# Patient Record
Sex: Male | Born: 1970 | Race: Black or African American | Hispanic: No | Marital: Single | Smoking: Never smoker
Health system: Southern US, Community
[De-identification: ages and names within clinical notes are randomized; demographics above are authoritative.]

## PROBLEM LIST (undated history)

## (undated) DIAGNOSIS — T7840XA Allergy, unspecified, initial encounter: Secondary | ICD-10-CM

## (undated) DIAGNOSIS — E119 Type 2 diabetes mellitus without complications: Secondary | ICD-10-CM

## (undated) DIAGNOSIS — I639 Cerebral infarction, unspecified: Secondary | ICD-10-CM

## (undated) DIAGNOSIS — K649 Unspecified hemorrhoids: Secondary | ICD-10-CM

## (undated) DIAGNOSIS — I10 Essential (primary) hypertension: Secondary | ICD-10-CM

## (undated) HISTORY — DX: Allergy, unspecified, initial encounter: T78.40XA

## (undated) HISTORY — PX: WISDOM TOOTH EXTRACTION: SHX21

## (undated) HISTORY — DX: Type 2 diabetes mellitus without complications: E11.9

## (undated) HISTORY — DX: Essential (primary) hypertension: I10

---

## 2000-03-12 ENCOUNTER — Emergency Department (HOSPITAL_COMMUNITY): Admission: EM | Admit: 2000-03-12 | Discharge: 2000-03-12 | Payer: Self-pay

## 2011-10-07 ENCOUNTER — Emergency Department (INDEPENDENT_AMBULATORY_CARE_PROVIDER_SITE_OTHER)
Admission: EM | Admit: 2011-10-07 | Discharge: 2011-10-07 | Disposition: A | Discharge status: Home / Self Care | Payer: Self-pay | Source: Home / Self Care | Attending: Emergency Medicine | Admitting: Emergency Medicine

## 2011-10-07 ENCOUNTER — Encounter (HOSPITAL_COMMUNITY): Payer: Self-pay

## 2011-10-07 DIAGNOSIS — K1230 Oral mucositis (ulcerative), unspecified: Secondary | ICD-10-CM

## 2011-10-07 DIAGNOSIS — K121 Other forms of stomatitis: Secondary | ICD-10-CM

## 2011-10-07 DIAGNOSIS — K056 Periodontal disease, unspecified: Secondary | ICD-10-CM

## 2011-10-07 MED ORDER — ACETAMINOPHEN-CODEINE #3 300-30 MG PO TABS: 1.0000 | ORAL_TABLET | Freq: Four times a day (QID) | ORAL | Status: DC | PRN

## 2011-10-07 MED ORDER — PENICILLIN V POTASSIUM 500 MG PO TABS: 500.0000 mg | ORAL_TABLET | Freq: Three times a day (TID) | ORAL | Status: AC

## 2011-10-07 NOTE — ED Provider Notes (Signed)
History     CSN: [id number]  Arrival date & time 10/07/11  1233   First MD Initiated Contact with Patient 10/07/11 1251      Chief Complaint  Patient presents with  . Oral Swelling    (Consider location/radiation/quality/duration/timing/severity/associated sxs/prior treatment) HPI Comments: Patient presents urgent care complaining for approximately 2 weeks he's been having some upper right sided mouth pain that he feels the pain originates from his gum line he has not noticed, any facial swelling, denies any fevers or headaches. Does describe that he have had mental problems before and has been about a year that he has not seen a dentist.  The history is provided by the patient.    History reviewed. No pertinent past medical history.  Past Surgical History  Procedure Date  . Wisdom tooth extraction     No family history on file.  History  Substance Use Topics  . Smoking status: Not on file  . Smokeless tobacco: Not on file  . Alcohol Use:       Review of Systems  Constitutional: Negative for chills, activity change and appetite change.  HENT: Positive for dental problem.   Neurological: Negative for dizziness, facial asymmetry and headaches.    Allergies  Review of patient's allergies indicates no known allergies.  Home Medications   Current Outpatient Rx  Name Route Sig Dispense Refill  . ACETAMINOPHEN-CODEINE #3 300-30 MG PO TABS Oral Take 1-2 tablets by mouth every 6 (six) hours as needed for pain. 15 tablet 0  . PENICILLIN V POTASSIUM 500 MG PO TABS Oral Take 1 tablet (500 mg total) by mouth 3 (three) times daily. 15 tablet 0    BP 121/79  Pulse 72  Temp 97 F (36.1 C) (Oral)  Resp 16  SpO2 100%  Physical Exam  Nursing note and vitals reviewed. Constitutional: He appears well-developed and well-nourished.  HENT:  Right Ear: Tympanic membrane normal.  Left Ear: Tympanic membrane normal.  Mouth/Throat: Uvula is midline. Abnormal dentition. No  uvula swelling. No oropharyngeal exudate or tonsillar abscesses.    Eyes: Conjunctivae normal are normal. Pupils are equal, round, and reactive to light.  Neck: Neck supple.  Lymphadenopathy:    He has no cervical adenopathy.  Neurological: He is alert.  Skin: Skin is warm. No erythema.    ED Course  Procedures (including critical care time)  Labs Reviewed - No data to display No results found.   1. Periodontal disease   2. Stomatitis       MDM  Periodontal disease with gingivitis exam. Patient is being provided with a referral to see our on-call dentist. He started him penicillin for 5 days along with 15 tablets of Tylenol No. 3 for pain and discomfort control. Patient agrees with treatment plan and will followup with dentist as instructed and recommended. On exam no obvious signs of a apical abscess was noted        Jimmie Molly, MD 10/07/11 1544

## 2011-10-07 NOTE — ED Notes (Signed)
Complains of mouth/gum pain, sx started 2 weeks ago, subsided and then returned

## 2013-06-10 ENCOUNTER — Encounter (HOSPITAL_COMMUNITY): Payer: Self-pay | Admitting: Emergency Medicine

## 2013-06-10 ENCOUNTER — Emergency Department (INDEPENDENT_AMBULATORY_CARE_PROVIDER_SITE_OTHER)
Admission: EM | Admit: 2013-06-10 | Discharge: 2013-06-10 | Disposition: A | Discharge status: Home / Self Care | Payer: Self-pay | Source: Home / Self Care | Attending: Family Medicine | Admitting: Family Medicine

## 2013-06-10 DIAGNOSIS — K0889 Other specified disorders of teeth and supporting structures: Secondary | ICD-10-CM

## 2013-06-10 DIAGNOSIS — K029 Dental caries, unspecified: Secondary | ICD-10-CM

## 2013-06-10 DIAGNOSIS — K089 Disorder of teeth and supporting structures, unspecified: Secondary | ICD-10-CM

## 2013-06-10 DIAGNOSIS — K0510 Chronic gingivitis, plaque induced: Secondary | ICD-10-CM

## 2013-06-10 MED ORDER — PENICILLIN V POTASSIUM 500 MG PO TABS: 500.0000 mg | ORAL_TABLET | Freq: Three times a day (TID) | ORAL | Status: AC

## 2013-06-10 MED ORDER — OXYCODONE-ACETAMINOPHEN 5-325 MG PO TABS: 1.0000 | ORAL_TABLET | ORAL | Status: DC | PRN

## 2013-06-10 NOTE — ED Provider Notes (Signed)
Medical screening examination/treatment/procedure(s) were performed by a resident physician or non-physician practitioner and as the supervising physician I was immediately available for consultation/collaboration.  Panagiota Perfetti, MD    Eshan Trupiano S Latima Hamza, MD 06/10/13 1815 

## 2013-06-10 NOTE — ED Notes (Signed)
Reports right , upper tooth issues, several teeth.  This episode started 2 days ago.  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

## 2013-06-10 NOTE — Discharge Instructions (Signed)
Take medications as directed and arrange follow up with the dentist as soon as possible.  Dental Caries Dental caries is tooth decay. This decay can cause a hole in teeth (cavity) that can get bigger and deeper over time. HOME CARE  Brush and floss your teeth. Do this at least two times a day.  Use a fluoride toothpaste.  Use a mouth rinse if told by your dentist or doctor.  Eat less sugary and starchy foods. Drink less sugary drinks.  Avoid snacking often on sugary and starchy foods. Avoid sipping often on sugary drinks.  Keep regular checkups and cleanings with your dentist.  Use fluoride supplements if told by your dentist or doctor.  Allow fluoride to be applied to teeth if told by your dentist or doctor. MAKE SURE YOU:  Understand these instructions.  Will watch your condition.  Will get help right away if you are not doing well or get worse. Document Released: 10/01/2007 Document Revised: 08/24/2012 Document Reviewed: 12/25/2011 [facility] Patient Information 2014 [place], [place].  Dental Pain Toothache is pain in or around a tooth. It may get worse with chewing or with cold or heat.  HOME CARE  Your dentist may use a numbing medicine during treatment. If so, you may need to avoid eating until the medicine wears off. Ask your dentist about this.  Only take medicine as told by your dentist or doctor.  Avoid chewing food near the painful tooth until after all treatment is done. Ask your dentist about this. GET HELP RIGHT AWAY IF:   The problem gets worse or new problems appear.  You have a fever.  There is redness and puffiness (swelling) of the face, jaw, or neck.  You cannot open your mouth.  There is pain in the jaw.  There is very bad pain that is not helped by medicine. MAKE SURE YOU:   Understand these instructions.  Will watch your condition.  Will get help right away if you are not doing well or get worse. Document Released: 06/10/2007 Document  Revised: 03/16/2011 Document Reviewed: 06/10/2007 [facility] Patient Information 2014 [place], [place].  Gingivitis  Gingivitis is an infection of the teeth and bones that support the teeth. Your gums become red, sore, and puffy (swollen). It is caused by germs that build up on your teeth and gums (plaque). HOME CARE  Floss and then brush your teeth.  Brush at least twice a day.  Floss at least once a day.  Avoid sugar between meals.  Do not drink juice before bed. Only drink water.  Make and keep your regular checkups and cleanings with your dentist.  Use any mouth care product or toothpaste as told by your dentist. GET HELP RIGHT AWAY IF:  You have painful, red tissue around your teeth.  You have trouble chewing.  You have loose or infected teeth. MAKE SURE YOU:  Understand these instructions.  Will watch your condition.  Will get help right away if you are not doing well or get worse. Document Released: 01/24/2010 Document Revised: 03/16/2011 Document Reviewed: 01/24/2010 [facility] Patient Information 2014 [place], [place].  Periodontal Disease Periodontal disease, or gum disease, is a type of oral disease that affects the surrounding and supporting tissues of the teeth. These include the gums (gingivae), ligaments, and tooth socket (alveolar bone). Periodontal disease can affect one tooth or many teeth. If left untreated, it may lead to tooth loss.  CAUSES The main cause of periodontal disease is dental plaque, which contains harmful bacteria. These bacteria  can cause the gums to become inflamed and infected. Further progression of the disease can damage the other supporting tissues.  RISK FACTORS  Diabetes.   Smoking and tobacco use.   Genetics.   Hormonal changes of puberty, menopause, and pregnancy.   Stress.   Clenching or grinding your teeth.   Substance abuse.  Poor nutrition.   Diseases that interfere with the body's immune system.   Certain  medicines. SIGNS AND SYMPTOMS  Red or swollen gums.  Bad breath that does not go away.  Gums that have pulled away from the teeth.  Gums that bleed easily.  Permanent teeth that are loose or separating.  Pain when chewing.  Changes in the way your teeth fit together.  Sensitive teeth. DIAGNOSIS  A thorough examination of the periodontal tissues will be done by your dentist. X-rays may be needed. Evaluation of your medical history will be needed to see if there are other factors or underlying conditions that may contribute to the disease. TREATMENT The number and types of treatment will vary depending on the extent of the disease. Treatment may include brushing and flossing only. Further disease progression may necessitate scaling and root planing or even surgery. The main goal is to control the infection. Good oral hygiene at home is necessary for the success of all types of treatment. HOME CARE INSTRUCTIONS   Practice good oral hygiene. This includes flossing and brushing your teeth every day.   See your dentist regularly, at least 2 times per year.   Stop smoking if you smoke.  Eat a well-balanced diet. SEEK IMMEDIATE DENTAL CARE IF:   You have any signs or symptoms of periodontal disease along with:  Swelling of your face, neck, or jaw.  Inability to open your mouth.  Severe pain uncontrolled by pain medicine.  You have a fever or persistent symptoms for more than 2 3 days.  You have a fever and your symptoms suddenly get worse. Document Released: 12/25/2002 Document Revised: 08/24/2012 Document Reviewed: 05/31/2012 [facility] Patient Information 2014 [place]

## 2013-06-10 NOTE — ED Provider Notes (Signed)
CSN: [id number]     Arrival date & time 06/10/13  1520 History   First MD Initiated Contact with Patient 06/10/13 1541     Chief Complaint  Patient presents with  . Dental Pain   (Consider location/radiation/quality/duration/timing/severity/associated sxs/prior Treatment) Patient is a 43 y.o. male presenting with tooth pain. The history is provided by the patient.  Dental Pain Location:  Upper Upper teeth location:  2/RU 2nd molar and 1/RU 3rd molar Quality:  Aching and throbbing Severity:  Moderate Onset quality:  Gradual Duration:  2 days Timing:  Constant Progression:  Worsening Chronicity:  New Context: dental caries and filling fell out   Relieved by:  Nothing Worsened by:  Cold food/drink and pressure Associated symptoms: gum swelling   Associated symptoms: no difficulty swallowing, no facial pain, no facial swelling, no fever, no neck pain, no neck swelling, no oral bleeding and no oral lesions   Risk factors: lack of dental care and periodontal disease   Risk factors: no alcohol problem, no cancer, no diabetes, no immunosuppression and no smoking   Pt reports known loss of a filling to RU 3rd molar approx 1 year ago. Has not given him problems until recently. 2 day h/o worsening pain. Also has known early peridontal disease and states his gums around the RU 2nd and 3rd molars is very TTP.   History reviewed. No pertinent past medical history. Past Surgical History  Procedure Laterality Date  . Wisdom tooth extraction     No family history on file. History  Substance Use Topics  . Smoking status: Never Smoker   . Smokeless tobacco: Not on file  . Alcohol Use: No    Review of Systems  Constitutional: Negative for fever.  HENT: Negative for facial swelling and mouth sores.   Musculoskeletal: Negative for neck pain.  All other systems reviewed and are negative.   Allergies  Review of patient's allergies indicates no known allergies.  Home Medications   Prior to  Admission medications   Medication Sig Start Date End Date Taking? Authorizing Provider  ibuprofen (ADVIL,MOTRIN) 200 MG tablet Take 200 mg by mouth every 6 (six) hours as needed.   Yes Historical Provider, MD  acetaminophen-codeine (TYLENOL #3) 300-30 MG per tablet Take 1-2 tablets by mouth every 6 (six) hours as needed for pain. 10/07/11   Jimmie MollyPaolo Coll, MD  oxyCODONE-acetaminophen (PERCOCET/ROXICET) 5-325 MG per tablet Take 1 tablet by mouth every 4 (four) hours as needed for severe pain. 06/10/13   Roma KayserKatherine P Emmarae Cowdery, NP  penicillin v potassium (VEETID) 500 MG tablet Take 1 tablet (500 mg total) by mouth 3 (three) times daily. 06/10/13 06/17/13  Roma KayserKatherine P Derryl Uher, NP   BP 124/72  Pulse 79  Temp(Src) 98 F (36.7 C) (Oral)  Resp 17  SpO2 97% Physical Exam  Nursing note and vitals reviewed. Constitutional: He is oriented to person, place, and time. He appears well-developed and well-nourished.  HENT:  Head: Normocephalic and atraumatic.  Mouth/Throat:    Eyes: Conjunctivae are normal.  Neck: Neck supple.  Cardiovascular: Normal rate.   Pulmonary/Chest: Effort normal.  Neurological: He is alert and oriented to person, place, and time.  Skin: Skin is warm and dry.  Psychiatric: He has a normal mood and affect.    ED Course  Procedures (including critical care time) Labs Review Labs Reviewed - No data to display  Imaging Review No results found.   MDM   1. Tooth decay   2. Gingivitis   3. Toothache  Pen-V-K 500 mg TID x 10 days w/ a short course of medication for pain and f/u w/ dentist.    Roma Kayser Aariya Ferrick, NP 06/10/13 [phone]

## 2014-05-25 ENCOUNTER — Ambulatory Visit (INDEPENDENT_AMBULATORY_CARE_PROVIDER_SITE_OTHER): Payer: BC Managed Care – PPO

## 2014-05-25 ENCOUNTER — Ambulatory Visit (INDEPENDENT_AMBULATORY_CARE_PROVIDER_SITE_OTHER): Payer: BC Managed Care – PPO | Admitting: Emergency Medicine

## 2014-05-25 VITALS — BP 110/80 | HR 88 | Temp 98.9°F | Resp 16 | Ht 70.0 in | Wt 218.0 lb

## 2014-05-25 DIAGNOSIS — R5382 Chronic fatigue, unspecified: Secondary | ICD-10-CM

## 2014-05-25 DIAGNOSIS — R0602 Shortness of breath: Secondary | ICD-10-CM

## 2014-05-25 DIAGNOSIS — Z0000 Encounter for general adult medical examination without abnormal findings: Secondary | ICD-10-CM | POA: Diagnosis not present

## 2014-05-25 DIAGNOSIS — K625 Hemorrhage of anus and rectum: Secondary | ICD-10-CM

## 2014-05-25 DIAGNOSIS — Z23 Encounter for immunization: Secondary | ICD-10-CM

## 2014-05-25 DIAGNOSIS — Z13220 Encounter for screening for lipoid disorders: Secondary | ICD-10-CM

## 2014-05-25 LAB — POCT CBC
GRANULOCYTE PERCENT: 47.3 % (ref 37–80)
HCT, POC: 47.8 % (ref 43.5–53.7)
Hemoglobin: 15 g/dL (ref 14.1–18.1)
Lymph, poc: 2.6 (ref 0.6–3.4)
MCH, POC: 29.5 pg (ref 27–31.2)
MCHC: 31.5 g/dL — AB (ref 31.8–35.4)
MCV: 93.6 fL (ref 80–97)
MID (CBC): 0.4 (ref 0–0.9)
MPV: 8.4 fL (ref 0–99.8)
PLATELET COUNT, POC: 188 10*3/uL (ref 142–424)
POC GRANULOCYTE: 2.7 (ref 2–6.9)
POC LYMPH PERCENT: 45.9 %L (ref 10–50)
POC MID %: 6.8 % (ref 0–12)
RBC: 5.1 M/uL (ref 4.69–6.13)
RDW, POC: 14.6 %
WBC: 5.7 10*3/uL (ref 4.6–10.2)

## 2014-05-25 LAB — LIPID PANEL
CHOL/HDL RATIO: 6.8 ratio
CHOLESTEROL: 239 mg/dL — AB (ref 0–200)
HDL: 35 mg/dL — ABNORMAL LOW (ref >=40)
LDL Cholesterol: 180 mg/dL — ABNORMAL HIGH (ref 0–99)
TRIGLYCERIDES: 118 mg/dL (ref <150)
VLDL: 24 mg/dL (ref 0–40)

## 2014-05-25 LAB — POCT UA - MICROSCOPIC ONLY
BACTERIA, U MICROSCOPIC: NEGATIVE
CRYSTALS, UR, HPF, POC: NEGATIVE
Casts, Ur, LPF, POC: NEGATIVE
Mucus, UA: POSITIVE
Yeast, UA: NEGATIVE

## 2014-05-25 LAB — COMPLETE METABOLIC PANEL WITH GFR
ALBUMIN: 4.1 g/dL (ref 3.5–5.2)
ALT: 33 U/L (ref 0–53)
AST: 21 U/L (ref 0–37)
Alkaline Phosphatase: 71 U/L (ref 39–117)
BUN: 8 mg/dL (ref 6–23)
CALCIUM: 9.5 mg/dL (ref 8.4–10.5)
CHLORIDE: 104 meq/L (ref 96–112)
CO2: 27 meq/L (ref 19–32)
Creat: 1.09 mg/dL (ref 0.50–1.35)
GFR, EST NON AFRICAN AMERICAN: 82 mL/min
GFR, Est African American: >89 mL/min
GLUCOSE: 108 mg/dL — AB (ref 70–99)
Potassium: 4.1 mEq/L (ref 3.5–5.3)
Sodium: 139 mEq/L (ref 135–145)
Total Bilirubin: 0.5 mg/dL (ref 0.2–1.2)
Total Protein: 7.5 g/dL (ref 6.0–8.3)

## 2014-05-25 LAB — POCT URINALYSIS DIPSTICK
Bilirubin, UA: NEGATIVE
Glucose, UA: NEGATIVE
Ketones, UA: NEGATIVE
Leukocytes, UA: NEGATIVE
Nitrite, UA: NEGATIVE
PROTEIN UA: NEGATIVE
UROBILINOGEN UA: 0.2
pH, UA: 5.5

## 2014-05-25 LAB — IFOBT (OCCULT BLOOD): IMMUNOLOGICAL FECAL OCCULT BLOOD TEST: NEGATIVE

## 2014-05-25 LAB — TSH: TSH: 0.607 u[IU]/mL (ref 0.350–4.500)

## 2014-05-25 MED ORDER — TETANUS-DIPHTH-ACELL PERTUSSIS 5-2.5-18.5 LF-MCG/0.5 IM SUSP
0.5000 mL | Freq: Once | INTRAMUSCULAR | Status: AC
  Administered 2014-05-25: 0.5 mL via INTRAMUSCULAR

## 2014-05-25 NOTE — Patient Instructions (Signed)
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
## 2014-05-26 ENCOUNTER — Encounter: Payer: Self-pay | Admitting: Emergency Medicine

## 2014-05-26 LAB — HEPATITIS C ANTIBODY: HCV Ab: NEGATIVE

## 2014-05-26 LAB — PSA: PSA: 0.3 ng/mL (ref <=4.00)

## 2014-05-26 LAB — HIV ANTIBODY (ROUTINE TESTING W REFLEX): HIV 1&2 Ab, 4th Generation: NONREACTIVE

## 2015-03-30 ENCOUNTER — Ambulatory Visit (INDEPENDENT_AMBULATORY_CARE_PROVIDER_SITE_OTHER): Payer: BC Managed Care – PPO | Admitting: Family Medicine

## 2015-03-30 VITALS — BP 118/82 | HR 85 | Temp 98.5°F | Resp 16 | Ht 69.5 in | Wt 215.0 lb

## 2015-03-30 DIAGNOSIS — R079 Chest pain, unspecified: Secondary | ICD-10-CM

## 2015-03-30 DIAGNOSIS — R9431 Abnormal electrocardiogram [ECG] [EKG]: Secondary | ICD-10-CM | POA: Diagnosis not present

## 2015-03-30 DIAGNOSIS — R739 Hyperglycemia, unspecified: Secondary | ICD-10-CM | POA: Diagnosis not present

## 2015-03-30 DIAGNOSIS — H6121 Impacted cerumen, right ear: Secondary | ICD-10-CM | POA: Diagnosis not present

## 2015-03-30 DIAGNOSIS — Z658 Other specified problems related to psychosocial circumstances: Secondary | ICD-10-CM

## 2015-03-30 DIAGNOSIS — E785 Hyperlipidemia, unspecified: Secondary | ICD-10-CM

## 2015-03-30 DIAGNOSIS — R109 Unspecified abdominal pain: Secondary | ICD-10-CM | POA: Diagnosis not present

## 2015-03-30 DIAGNOSIS — K921 Melena: Secondary | ICD-10-CM | POA: Diagnosis not present

## 2015-03-30 DIAGNOSIS — E119 Type 2 diabetes mellitus without complications: Secondary | ICD-10-CM

## 2015-03-30 DIAGNOSIS — R12 Heartburn: Secondary | ICD-10-CM

## 2015-03-30 DIAGNOSIS — F439 Reaction to severe stress, unspecified: Secondary | ICD-10-CM

## 2015-03-30 LAB — POCT CBC
Granulocyte percent: 37.8 %G (ref 37–80)
HCT, POC: 43.9 % (ref 43.5–53.7)
HEMOGLOBIN: 15.4 g/dL (ref 14.1–18.1)
LYMPH, POC: 3.6 — AB (ref 0.6–3.4)
MCH: 31.5 pg — AB (ref 27–31.2)
MCHC: 35 g/dL (ref 31.8–35.4)
MCV: 89.8 fL (ref 80–97)
MID (cbc): 0.6 (ref 0–0.9)
MPV: 8.4 fL (ref 0–99.8)
PLATELET COUNT, POC: 165 10*3/uL (ref 142–424)
POC Granulocyte: 2.6 (ref 2–6.9)
POC LYMPH PERCENT: 53.3 %L — AB (ref 10–50)
POC MID %: 8.9 %M (ref 0–12)
RBC: 4.89 M/uL (ref 4.69–6.13)
RDW, POC: 14 %
WBC: 6.8 10*3/uL (ref 4.6–10.2)

## 2015-03-30 LAB — GLUCOSE, POCT (MANUAL RESULT ENTRY): POC GLUCOSE: 98 mg/dL (ref 70–99)

## 2015-03-30 LAB — POCT GLYCOSYLATED HEMOGLOBIN (HGB A1C): Hemoglobin A1C: 6.6

## 2015-03-30 MED ORDER — OMEPRAZOLE 20 MG PO CPDR: 20.0000 mg | DELAYED_RELEASE_CAPSULE | Freq: Every day | ORAL | Status: DC

## 2015-03-30 NOTE — Patient Instructions (Addendum)
IF you received an x-ray today, you will receive an invoice from [facility]. Please contact [facility] at [phone] with questions or concerns regarding your invoice.   IF you received labwork today, you will receive an invoice from [employer]. Please contact [facility] at [phone] with questions or concerns regarding your invoice.   Our billing staff will not be able to assist you with questions regarding bills from these companies.  You will be contacted with the lab results as soon as they are available. The fastest way to get your results is to activate your My Chart account. Instructions are located on the last page of this paperwork. If you have not heard from [place] regarding the results in 2 weeks, please contact this office.     If ears are blocked again in the future, you can try [facility] over-the-counter then return if not improved in 1-2 days.  Your 3 month average does indicate diabetes, but borderline. Can work on diet, exercise for now, recheck this level in the next 3 months. I will check a test for protein in the urine as this is recommended once a year for diabetes. See other information below for diabetes.  Start over-the-counter Prilosec once per day, and avoid foods below that may worsen reflux or heartburn, as these may also affect your stomach. I will refer you to a gastroenterologist to discuss the blood in the stool, and abdominal pain further. I am checking some kidney and liver tests, but your blood count here looks okay.  See information below for stress and stress management. If you feel this is worsening, return to discuss this further.  Your EKG is similar to the one you had last year, but questionable abnormality on a few areas. I will refer you to cardiology to discuss this further to determine if other testing needed, but if any increased chest pain or worsening symptoms prior to seeing cardiology -  return here  or emergency room. Start aspirin 81 mg per day. See more information below on chest pain.  Return to the clinic or go to the nearest emergency room if any of your symptoms worsen or new symptoms occur.   Nonspecific Chest Pain  Chest pain can be caused by many different conditions. There is always a chance that your pain could be related to something serious, such as a heart attack or a blood clot in your lungs. Chest pain can also be caused by conditions that are not life-threatening. If you have chest pain, it is very important to follow up with your health care provider. CAUSES  Chest pain can be caused by:  Heartburn.  Pneumonia or bronchitis.  Anxiety or stress.  Inflammation around your heart (pericarditis) or lung (pleuritis or pleurisy).  A blood clot in your lung.  A collapsed lung (pneumothorax). It can develop suddenly on its own (spontaneous pneumothorax) or from trauma to the chest.  Shingles infection (varicella-zoster virus).  Heart attack.  Damage to the bones, muscles, and cartilage that make up your chest wall. This can include:  Bruised bones due to injury.  Strained muscles or cartilage due to frequent or repeated coughing or overwork.  Fracture to one or more ribs.  Sore cartilage due to inflammation (costochondritis). RISK FACTORS  Risk factors for chest pain may include:  Activities that increase your risk for trauma or injury to your chest.  Respiratory infections or conditions that cause frequent coughing.  Medical conditions or overeating that can  cause heartburn.  Heart disease or family history of heart disease.  Conditions or health behaviors that increase your risk of developing a blood clot.  Having had chicken pox (varicella zoster). SIGNS AND SYMPTOMS Chest pain can feel like:  Burning or tingling on the surface of your chest or deep in your chest.  Crushing, pressure, aching, or squeezing pain.  Dull or sharp pain that is worse  when you move, cough, or take a deep breath.  Pain that is also felt in your back, neck, shoulder, or arm, or pain that spreads to any of these areas. Your chest pain may come and go, or it may stay constant. DIAGNOSIS Lab tests or other studies may be needed to find the cause of your pain. Your health care provider may have you take a test called an ambulatory ECG (electrocardiogram). An ECG records your heartbeat patterns at the time the test is performed. You may also have other tests, such as:  Transthoracic echocardiogram (TTE). During echocardiography, sound waves are used to create a picture of all of the heart structures and to look at how blood flows through your heart.  Transesophageal echocardiogram (TEE).This is a more advanced imaging test that obtains images from inside your body. It allows your health care provider to see your heart in finer detail.  Cardiac monitoring. This allows your health care provider to monitor your heart rate and rhythm in real time.  Holter monitor. This is a portable device that records your heartbeat and can help to diagnose abnormal heartbeats. It allows your health care provider to track your heart activity for several days, if needed.  Stress tests. These can be done through exercise or by taking medicine that makes your heart beat more quickly.  Blood tests.  Imaging tests. TREATMENT  Your treatment depends on what is causing your chest pain. Treatment may include:  Medicines. These may include:  Acid blockers for heartburn.  Anti-inflammatory medicine.  Pain medicine for inflammatory conditions.  Antibiotic medicine, if an infection is present.  Medicines to dissolve blood clots.  Medicines to treat coronary artery disease.  Supportive care for conditions that do not require medicines. This may include:  Resting.  Applying heat or cold packs to injured areas.  Limiting activities until pain decreases. HOME CARE  INSTRUCTIONS  If you were prescribed an antibiotic medicine, finish it all even if you start to feel better.  Avoid any activities that bring on chest pain.  Do not use any tobacco products, including cigarettes, chewing tobacco, or electronic cigarettes. If you need help quitting, ask your health care provider.  Do not drink alcohol.  Take medicines only as directed by your health care provider.  Keep all follow-up visits as directed by your health care provider. This is important. This includes any further testing if your chest pain does not go away.  If heartburn is the cause for your chest pain, you may be told to keep your head raised (elevated) while sleeping. This reduces the chance that acid will go from your stomach into your esophagus.  Make lifestyle changes as directed by your health care provider. These may include:  Getting regular exercise. Ask your health care provider to suggest some activities that are safe for you.  Eating a heart-healthy diet. A registered dietitian can help you to learn healthy eating options.  Maintaining a healthy weight.  Managing diabetes, if necessary.  Reducing stress. SEEK MEDICAL CARE IF:  Your chest pain does not go away after  treatment.  You have a rash with blisters on your chest.  You have a fever. SEEK IMMEDIATE MEDICAL CARE IF:   Your chest pain is worse.  You have an increasing cough, or you cough up blood.  You have severe abdominal pain.  You have severe weakness.  You faint.  You have chills.  You have sudden, unexplained chest discomfort.  You have sudden, unexplained discomfort in your arms, back, neck, or jaw.  You have shortness of breath at any time.  You suddenly start to sweat, or your skin gets clammy.  You feel nauseous or you vomit.  You suddenly feel light-headed or dizzy.  Your heart begins to beat quickly, or it feels like it is skipping beats. These symptoms may represent a serious  problem that is an emergency. Do not wait to see if the symptoms will go away. Get medical help right away. Call your local emergency services (911 in the [place] Do not drive yourself to the hospital.   This information is not intended to replace advice given to you by your health care provider. Make sure you discuss any questions you have with your health care provider.   Document Released: 10/01/2004 Document Revised: 01/12/2014 Document Reviewed: 07/28/2013 Elsevier Interactive Patient Education 2016 [place] for Gastroesophageal Reflux Disease, Adult When you have gastroesophageal reflux disease (GERD), the foods you eat and your eating habits are very important. Choosing the right foods can help ease the discomfort of GERD. WHAT GENERAL GUIDELINES DO I NEED TO FOLLOW?  Choose fruits, vegetables, whole grains, low-fat dairy products, and low-fat meat, fish, and poultry.  Limit fats such as oils, salad dressings, butter, nuts, and avocado.  Keep a food diary to identify foods that cause symptoms.  Avoid foods that cause reflux. These may be different for different people.  Eat frequent small meals instead of three large meals each day.  Eat your meals slowly, in a relaxed setting.  Limit fried foods.  Cook foods using methods other than frying.  Avoid drinking alcohol.  Avoid drinking large amounts of liquids with your meals.  Avoid bending over or lying down until 2-3 hours after eating. WHAT FOODS ARE NOT RECOMMENDED? The following are some foods and drinks that may worsen your symptoms: Vegetables Tomatoes. Tomato juice. Tomato and spaghetti sauce. Chili peppers. Onion and garlic. Horseradish. Fruits Oranges, grapefruit, and lemon (fruit and juice). Meats High-fat meats, fish, and poultry. This includes hot dogs, ribs, ham, sausage, salami, and bacon. Dairy Whole milk and chocolate milk. Sour cream. Cream. Butter. Ice cream. Cream cheese.   Beverages Coffee and tea, with or without caffeine. Carbonated beverages or energy drinks. Condiments Hot sauce. Barbecue sauce.  Sweets/Desserts Chocolate and cocoa. Donuts. Peppermint and spearmint. Fats and Oils High-fat foods, including [place] fries and potato chips. Other Vinegar. Strong spices, such as black pepper, white pepper, red pepper, cayenne, curry powder, cloves, ginger, and chili powder. The items listed above may not be a complete list of foods and beverages to avoid. Contact your dietitian for more information.   This information is not intended to replace advice given to you by your health care provider. Make sure you discuss any questions you have with your health care provider.   Document Released: 12/22/2004 Document Revised: 01/12/2014 Document Reviewed: 10/26/2012 Elsevier Interactive Patient Education 2016 [place].   Diabetes and Standards of Medical Care Diabetes is complicated. You may find that your diabetes team includes a  dietitian, nurse, diabetes educator, eye doctor, and more. To help everyone know what is going on and to help you get the care you deserve, the following schedule of care was developed to help keep you on track. Below are the tests, exams, vaccines, medicines, education, and plans you will need. HbA1c test This test shows how well you have controlled your glucose over the past 2-3 months. It is used to see if your diabetes management plan needs to be adjusted.   It is performed at least 2 times a year if you are meeting treatment goals.  It is performed 4 times a year if therapy has changed or if you are not meeting treatment goals. Blood pressure test  This test is performed at every routine medical visit. The goal is less than 140/90 mm Hg for most people, but 130/80 mm Hg in some cases. Ask your health care provider about your goal. Dental exam  Follow up with the dentist regularly. Eye exam  If you are diagnosed with type 1  diabetes as a child, get an exam upon reaching the age of 10 years or older and having had diabetes for 3-5 years. Yearly eye exams are recommended after that initial eye exam.  If you are diagnosed with type 1 diabetes as an adult, get an exam within 5 years of diagnosis and then yearly.  If you are diagnosed with type 2 diabetes, get an exam as soon as possible after the diagnosis and then yearly. Foot care exam  Visual foot exams are performed at every routine medical visit. The exams check for cuts, injuries, or other problems with the feet.  You should have a complete foot exam performed every year. This exam includes an inspection of the structure and skin of your feet, a check of the pulses in your feet, and a check of the sensation in your feet.  Type 1 diabetes: The first exam is performed 5 years after diagnosis.  Type 2 diabetes: The first exam is performed at the time of diagnosis.  Check your feet nightly for cuts, injuries, or other problems with your feet. Tell your health care provider if anything is not healing. Kidney function test (urine microalbumin)  This test is performed once a year.  Type 1 diabetes: The first test is performed 5 years after diagnosis.  Type 2 diabetes: The first test is performed at the time of diagnosis.  A serum creatinine and estimated glomerular filtration rate (eGFR) test is done once a year to assess the level of chronic kidney disease (CKD), if present. Lipid profile (cholesterol, HDL, LDL, triglycerides)  Performed every 5 years for most people.  The goal for LDL is less than 100 mg/dL. If you are at high risk, the goal is less than 70 mg/dL.  The goal for HDL is 40 mg/dL-50 mg/dL for men and 50 SK/SH-38 mg/dL for women. An HDL cholesterol of 60 mg/dL or higher gives some protection against heart disease.  The goal for triglycerides is less than 150 mg/dL. Immunizations  The flu (influenza) vaccine is recommended yearly for every  person 20 months of age or older who has diabetes.  The pneumonia (pneumococcal) vaccine is recommended for every person 52 years of age or older who has diabetes. Adults 108 years of age or older may receive the pneumonia vaccine as a series of two separate shots.  The hepatitis B vaccine is recommended for adults shortly after they have been diagnosed with diabetes.  The Tdap (tetanus,  diphtheria, and pertussis) vaccine should be given:  According to normal childhood vaccination schedules, for children.  Every 10 years, for adults who have diabetes. Diabetes self-management education  Education is recommended at diagnosis and ongoing as needed. Treatment plan  Your treatment plan is reviewed at every medical visit.   This information is not intended to replace advice given to you by your health care provider. Make sure you discuss any questions you have with your health care provider.   Document Released: 10/19/2008 Document Revised: 01/12/2014 Document Reviewed: 05/24/2012 Elsevier Interactive Patient Education 2016 [place] and Stress Management Stress is a normal reaction to life events. It is what you feel when life demands more than you are used to or more than you can handle. Some stress can be useful. For example, the stress reaction can help you catch the last bus of the day, study for a test, or meet a deadline at work. But stress that occurs too often or for too long can cause problems. It can affect your emotional health and interfere with relationships and normal daily activities. Too much stress can weaken your immune system and increase your risk for physical illness. If you already have a medical problem, stress can make it worse. CAUSES  All sorts of life events may cause stress. An event that causes stress for one person may not be stressful for another person. Major life events commonly cause stress. These may be positive or negative. Examples include losing  your job, moving into a new home, getting married, having a baby, or losing a loved one. Less obvious life events may also cause stress, especially if they occur day after day or in combination. Examples include working long hours, driving in traffic, caring for children, being in debt, or being in a difficult relationship. SIGNS AND SYMPTOMS Stress may cause emotional symptoms including, the following:  Anxiety. This is feeling worried, afraid, on edge, overwhelmed, or out of control.  Anger. This is feeling irritated or impatient.  Depression. This is feeling sad, down, helpless, or guilty.  Difficulty focusing, remembering, or making decisions. Stress may cause physical symptoms, including the following:   Aches and pains. These may affect your head, neck, back, stomach, or other areas of your body.  Tight muscles or clenched jaw.  Low energy or trouble sleeping. Stress may cause unhealthy behaviors, including the following:   Eating to feel better (overeating) or skipping meals.  Sleeping too little, too much, or both.  Working too much or putting off tasks (procrastination).  Smoking, drinking alcohol, or using drugs to feel better. DIAGNOSIS  Stress is diagnosed through an assessment by your health care provider. Your health care provider will ask questions about your symptoms and any stressful life events.Your health care provider will also ask about your medical history and may order blood tests or other tests. Certain medical conditions and medicine can cause physical symptoms similar to stress. Mental illness can cause emotional symptoms and unhealthy behaviors similar to stress. Your health care provider may refer you to a mental health professional for further evaluation.  TREATMENT  Stress management is the recommended treatment for stress.The goals of stress management are reducing stressful life events and coping with stress in healthy ways.  Techniques for reducing  stressful life events include the following:  Stress identification. Self-monitor for stress and identify what causes stress for you. These skills may help you to avoid some stressful events.  Time management. Set your priorities, keep a  calendar of events, and learn to say "no." These tools can help you avoid making too many commitments. Techniques for coping with stress include the following:  Rethinking the problem. Try to think realistically about stressful events rather than ignoring them or overreacting. Try to find the positives in a stressful situation rather than focusing on the negatives.  Exercise. Physical exercise can release both physical and emotional tension. The key is to find a form of exercise you enjoy and do it regularly.  Relaxation techniques. These relax the body and mind. Examples include yoga, meditation, tai chi, biofeedback, deep breathing, progressive muscle relaxation, listening to music, being out in nature, journaling, and other hobbies. Again, the key is to find one or more that you enjoy and can do regularly.  Healthy lifestyle. Eat a balanced diet, get plenty of sleep, and do not smoke. Avoid using alcohol or drugs to relax.  Strong support network. Spend time with family, friends, or other people you enjoy being around.Express your feelings and talk things over with someone you trust. Counseling or talktherapy with a mental health professional may be helpful if you are having difficulty managing stress on your own. Medicine is typically not recommended for the treatment of stress.Talk to your health care provider if you think you need medicine for symptoms of stress. HOME CARE INSTRUCTIONS  Keep all follow-up visits as directed by your health care provider.  Take all medicines as directed by your health care provider. SEEK MEDICAL CARE IF:  Your symptoms get worse or you start having new symptoms.  You feel overwhelmed by your problems and can no  longer manage them on your own. SEEK IMMEDIATE MEDICAL CARE IF:  You feel like hurting yourself or someone else.   This information is not intended to replace advice given to you by your health care provider. Make sure you discuss any questions you have with your health care provider.   Document Released: 06/17/2000 Document Revised: 01/12/2014 Document Reviewed: 08/16/2012 Elsevier Interactive Patient Education [employer].

## 2015-03-30 NOTE — Progress Notes (Addendum)
Subjective:    Patient ID: Andre Castro, male    DOB: [birth year], 45 y.o.   MRN: [id number] By signing my name below, I, Judithe Modest, attest that this documentation has been prepared under the direction and in the presence of Merri Ray, MD. Electronically Signed: Judithe Modest, ER Scribe. 03/30/2015. 4:27 PM.  Chief Complaint  Patient presents with  . Annual Exam   HPI HPI Comments: Andre Castro is a 45 y.o. male with a hx of blood in stool, hyperlipidemia, hyperglycemia who presents to [facility] reporting for a complete physical. On further review he is not eligible for a physical until May 20th, 2017 so he is going to have a general checkup. He has had some intermittent abdominal discomfort for the last year as well as intermittent blood in stool, once per month for the last year. He denies night sweats, fatigue or unexplained weight loss. He has no family hx of colon cancer. He has been taking baking soda in water which seems to help his lower abdominal discomfort. He has no family hx of heart disease. He does not drink alcohol. He denies diarrhea or vomiting associated with abdominal discomfort. He declines flu shot. [facility]. His choesterol was high and his A1C was 6.5 at that visit.  He also states he has had some intermittent chest pressure as well as some upper abdominal discomfort for the last few days. It is not associated with eating. He tends to have this sensation when he lays down to go to bed. His job has been more stressful recently.   The pts last physical was in may 2016 with Dr. Everlene Farrier. Noted at that physical, he had an asymptomatic umbilical hernia and hemorrhoids. He was referred to GI. He never reported to GI. At that visit discussed possible sleep apnea and possible sleep test, but sleep testing was deferred. Screening labs were done. Noted to have hyperlipidemia. Lipitor and a baby aspirin were  recommended at that visit, but he never started those medications. He was noted to have hyperglycemia, with a glucose of 108. He had negative HIV and Hep C at that time. He was recommended to follow up in three months. He has not been seen until today.    Cancer screening:  Lab Results  Component Value Date   PSA 0.30 05/25/2014  He has never had a colonoscopy, but would like to have one before age 25.   Immunizations: Immunization History  Administered Date(s) Administered  . Tdap 05/25/2014   Vision: No exam data present   HLD: Lab Results  Component Value Date   CHOL 239* 05/25/2014   HDL 35* 05/25/2014   LDLCALC 180* 05/25/2014   TRIG 118 05/25/2014   CHOLHDL 6.8 05/25/2014   Hyperglycemia: No results found for: HGBA1C  Exercise:   Patient Active Problem List   Diagnosis Date Noted  . Rectal bleeding 05/25/2014   Past Medical History  Diagnosis Date  . Allergy   . Diabetes mellitus without complication [facility])    Past Surgical History  Procedure Laterality Date  . Wisdom tooth extraction     No Known Allergies Prior to Admission medications   Medication Sig Start Date End Date Taking? Authorizing Provider  ibuprofen (ADVIL,MOTRIN) 200 MG tablet Take 200 mg by mouth every 6 (six) hours as needed.    Historical Provider, MD   Social History   Social History  . Marital  Status: Single    Spouse Name: N/A  . Number of Children: N/A  . Years of Education: 20   Occupational History  . Public [employer]    Social History Main Topics  . Smoking status: Never Smoker   . Smokeless tobacco: Never Used  . Alcohol Use: No  . Drug Use: No  . Sexual Activity: No   Other Topics Concern  . Not on file   Social History Narrative    Review of Systems  Constitutional: Negative for fever and chills.  Respiratory: Positive for chest tightness. Negative for shortness of breath.   Cardiovascular: Positive for chest pain. Negative for palpitations.  Gastrointestinal:  Positive for abdominal pain and blood in stool.      Objective:  BP 118/82 mmHg  Pulse 85  Temp(Src) 98.5 F (36.9 C) (Oral)  Resp 16  Ht 5' 9.5" (1.765 m)  Wt 215 lb (97.523 kg)  BMI 31.31 kg/m2  SpO2 96%  Physical Exam  Constitutional: He is oriented to person, place, and time. He appears well-developed and well-nourished. No distress.  HENT:  Head: Normocephalic and atraumatic.  Cerumen in bilateral canals obstructed on the right.   Eyes: EOM are normal. Pupils are equal, round, and reactive to light.  Neck: Neck supple. No JVD present. Carotid bruit is not present.  Cardiovascular: Normal rate, regular rhythm and normal heart sounds.  Exam reveals no gallop and no friction rub.   No murmur heard. Pulmonary/Chest: Effort normal and breath sounds normal. No respiratory distress. He has no rales.  Abdominal:  Negative murphies and mcburny's.   Musculoskeletal: Normal range of motion. He exhibits no edema.  Neurological: He is alert and oriented to person, place, and time. Coordination normal.  Skin: Skin is warm and dry. He is not diaphoretic.  Psychiatric: He has a normal mood and affect. His behavior is normal.  Nursing note and vitals reviewed.  Results for orders placed or performed in visit on 03/30/15  POCT glucose (manual entry)  Result Value Ref Range   POC Glucose 98 70 - 99 mg/dl  POCT CBC  Result Value Ref Range   WBC 6.8 4.6 - 10.2 K/uL   Lymph, poc 3.6 (A) 0.6 - 3.4   POC LYMPH PERCENT 53.3 (A) 10 - 50 %L   MID (cbc) 0.6 0 - 0.9   POC MID % 8.9 0 - 12 %M   POC Granulocyte 2.6 2 - 6.9   Granulocyte percent 37.8 37 - 80 %G   RBC 4.89 4.69 - 6.13 M/uL   Hemoglobin 15.4 14.1 - 18.1 g/dL   HCT, POC 49.3 28.1 - 53.7 %   MCV 89.8 80 - 97 fL   MCH, POC 31.5 (A) 27 - 31.2 pg   MCHC 35.0 31.8 - 35.4 g/dL   RDW, POC 27.2 %   Platelet Count, POC 165 142 - 424 K/uL   MPV 8.4 0 - 99.8 fL  POCT glycosylated hemoglobin (Hb A1C)  Result Value Ref Range   Hemoglobin  A1C 6.6        Assessment & Plan:   Andre Castro is a 45 y.o. male  Initially presented for CPE, but too early based on insurance. Advised to follow-up in May to have this performed.  Nonspecific abnormal electrocardiogram (ECG) (EKG) - Plan: Ambulatory referral to Cardiology Chest pain, unspecified chest pain type - Plan: EKG 12-Lead, Ambulatory referral to Cardiology  -Questionable irregularity/nonspecific T waves on EKG. Less likely cardiac causes chest pain, but  will have him evaluated by cardiology. ER/911 chest pain precautions reviewed.  Abdominal pain, unspecified abdominal location - Plan: Ambulatory referral to Gastroenterology, POCT CBC, omeprazole (PRILOSEC) 20 MG capsule  -Reassuring CBC. Possible gastritis, reflux, or comminution above. Trigger foods discussed for avoidance, start Prilosec 20 mg daily, referred to gastroenterology, RTC precautions if worsening.  Cerumen impaction, right  -I removed some of the initial cerumen with white loop. Followed by lavage bilaterally by staff. Resolution of symptoms both sides. RTC precautions  Hyperglycemia - Plan: POCT glucose (manual entry), Microalbumin, urine, POCT glycosylated hemoglobin (Hb A1C)Controlled type 2 diabetes mellitus without complication, without long-term current use of insulin (HCC)  -Check urine microalbumin, diet control for now. Recheck A1c in 3 months to determine if metformin indicated. Information given on after visit summary.  Hyperlipidemia - Plan: COMPLETE METABOLIC PANEL WITH GFR, Lipid panel  -Lipid panel, CMP pending  Heartburn - Plan: omeprazole (PRILOSEC) 20 MG capsule  -As above, trigger avoidance. Trial of Prilosec 20 mg daily.  Blood in stool  -Intermittent. Hemoglobin stable in office. Refer to gastroenterology for further evaluation, and consideration of colonoscopy.  Situational stress  -Stress management discussed. If he feels these symptoms are worsening, with more anxiety or  depression symptoms, return to clinic to discuss further.  Meds ordered this encounter  Medications  . omeprazole (PRILOSEC) 20 MG capsule    Sig: Take 1 capsule (20 mg total) by mouth daily.    Dispense:  30 capsule    Refill:  3   Patient Instructions       IF you received an x-ray today, you will receive an invoice from [facility]. Please contact [facility] at [phone] with questions or concerns regarding your invoice.   IF you received labwork today, you will receive an invoice from [employer]. Please contact [facility] at [phone] with questions or concerns regarding your invoice.   Our billing staff will not be able to assist you with questions regarding bills from these companies.  You will be contacted with the lab results as soon as they are available. The fastest way to get your results is to activate your My Chart account. Instructions are located on the last page of this paperwork. If you have not heard from [place] regarding the results in 2 weeks, please contact this office.     If ears are blocked again in the future, you can try [facility] over-the-counter then return if not improved in 1-2 days.  Your 3 month average does indicate diabetes, but borderline. Can work on diet, exercise for now, recheck this level in the next 3 months. I will check a test for protein in the urine as this is recommended once a year for diabetes. See other information below for diabetes.  Start over-the-counter Prilosec once per day, and avoid foods below that may worsen reflux or heartburn, as these may also affect your stomach. I will refer you to a gastroenterologist to discuss the blood in the stool, and abdominal pain further. I am checking some kidney and liver tests, but your blood count here looks okay.  See information below for stress and stress management. If you feel this is worsening, return to discuss this further.  Your EKG is  similar to the one you had last year, but questionable abnormality on a few areas. I will refer you to cardiology to discuss this further to determine if other testing needed, but if any increased chest pain or worsening symptoms prior to seeing  cardiology -  return here or emergency room. Start aspirin 81 mg per day. See more information below on chest pain.  Return to the clinic or go to the nearest emergency room if any of your symptoms worsen or new symptoms occur.   Nonspecific Chest Pain  Chest pain can be caused by many different conditions. There is always a chance that your pain could be related to something serious, such as a heart attack or a blood clot in your lungs. Chest pain can also be caused by conditions that are not life-threatening. If you have chest pain, it is very important to follow up with your health care provider. CAUSES  Chest pain can be caused by:  Heartburn.  Pneumonia or bronchitis.  Anxiety or stress.  Inflammation around your heart (pericarditis) or lung (pleuritis or pleurisy).  A blood clot in your lung.  A collapsed lung (pneumothorax). It can develop suddenly on its own (spontaneous pneumothorax) or from trauma to the chest.  Shingles infection (varicella-zoster virus).  Heart attack.  Damage to the bones, muscles, and cartilage that make up your chest wall. This can include:  Bruised bones due to injury.  Strained muscles or cartilage due to frequent or repeated coughing or overwork.  Fracture to one or more ribs.  Sore cartilage due to inflammation (costochondritis). RISK FACTORS  Risk factors for chest pain may include:  Activities that increase your risk for trauma or injury to your chest.  Respiratory infections or conditions that cause frequent coughing.  Medical conditions or overeating that can cause heartburn.  Heart disease or family history of heart disease.  Conditions or health behaviors that increase your risk of  developing a blood clot.  Having had chicken pox (varicella zoster). SIGNS AND SYMPTOMS Chest pain can feel like:  Burning or tingling on the surface of your chest or deep in your chest.  Crushing, pressure, aching, or squeezing pain.  Dull or sharp pain that is worse when you move, cough, or take a deep breath.  Pain that is also felt in your back, neck, shoulder, or arm, or pain that spreads to any of these areas. Your chest pain may come and go, or it may stay constant. DIAGNOSIS Lab tests or other studies may be needed to find the cause of your pain. Your health care provider may have you take a test called an ambulatory ECG (electrocardiogram). An ECG records your heartbeat patterns at the time the test is performed. You may also have other tests, such as:  Transthoracic echocardiogram (TTE). During echocardiography, sound waves are used to create a picture of all of the heart structures and to look at how blood flows through your heart.  Transesophageal echocardiogram (TEE).This is a more advanced imaging test that obtains images from inside your body. It allows your health care provider to see your heart in finer detail.  Cardiac monitoring. This allows your health care provider to monitor your heart rate and rhythm in real time.  Holter monitor. This is a portable device that records your heartbeat and can help to diagnose abnormal heartbeats. It allows your health care provider to track your heart activity for several days, if needed.  Stress tests. These can be done through exercise or by taking medicine that makes your heart beat more quickly.  Blood tests.  Imaging tests. TREATMENT  Your treatment depends on what is causing your chest pain. Treatment may include:  Medicines. These may include:  Acid blockers for heartburn.  Anti-inflammatory medicine.  Pain medicine for inflammatory conditions.  Antibiotic medicine, if an infection is present.  Medicines to  dissolve blood clots.  Medicines to treat coronary artery disease.  Supportive care for conditions that do not require medicines. This may include:  Resting.  Applying heat or cold packs to injured areas.  Limiting activities until pain decreases. HOME CARE INSTRUCTIONS  If you were prescribed an antibiotic medicine, finish it all even if you start to feel better.  Avoid any activities that bring on chest pain.  Do not use any tobacco products, including cigarettes, chewing tobacco, or electronic cigarettes. If you need help quitting, ask your health care provider.  Do not drink alcohol.  Take medicines only as directed by your health care provider.  Keep all follow-up visits as directed by your health care provider. This is important. This includes any further testing if your chest pain does not go away.  If heartburn is the cause for your chest pain, you may be told to keep your head raised (elevated) while sleeping. This reduces the chance that acid will go from your stomach into your esophagus.  Make lifestyle changes as directed by your health care provider. These may include:  Getting regular exercise. Ask your health care provider to suggest some activities that are safe for you.  Eating a heart-healthy diet. A registered dietitian can help you to learn healthy eating options.  Maintaining a healthy weight.  Managing diabetes, if necessary.  Reducing stress. SEEK MEDICAL CARE IF:  Your chest pain does not go away after treatment.  You have a rash with blisters on your chest.  You have a fever. SEEK IMMEDIATE MEDICAL CARE IF:   Your chest pain is worse.  You have an increasing cough, or you cough up blood.  You have severe abdominal pain.  You have severe weakness.  You faint.  You have chills.  You have sudden, unexplained chest discomfort.  You have sudden, unexplained discomfort in your arms, back, neck, or jaw.  You have shortness of breath at  any time.  You suddenly start to sweat, or your skin gets clammy.  You feel nauseous or you vomit.  You suddenly feel light-headed or dizzy.  Your heart begins to beat quickly, or it feels like it is skipping beats. These symptoms may represent a serious problem that is an emergency. Do not wait to see if the symptoms will go away. Get medical help right away. Call your local emergency services (911 in the [place] Do not drive yourself to the hospital.   This information is not intended to replace advice given to you by your health care provider. Make sure you discuss any questions you have with your health care provider.   Document Released: 10/01/2004 Document Revised: 01/12/2014 Document Reviewed: 07/28/2013 Elsevier Interactive Patient Education 2016 [place] for Gastroesophageal Reflux Disease, Adult When you have gastroesophageal reflux disease (GERD), the foods you eat and your eating habits are very important. Choosing the right foods can help ease the discomfort of GERD. WHAT GENERAL GUIDELINES DO I NEED TO FOLLOW?  Choose fruits, vegetables, whole grains, low-fat dairy products, and low-fat meat, fish, and poultry.  Limit fats such as oils, salad dressings, butter, nuts, and avocado.  Keep a food diary to identify foods that cause symptoms.  Avoid foods that cause reflux. These may be different for different people.  Eat frequent small meals instead of three large meals each day.  Eat  your meals slowly, in a relaxed setting.  Limit fried foods.  Cook foods using methods other than frying.  Avoid drinking alcohol.  Avoid drinking large amounts of liquids with your meals.  Avoid bending over or lying down until 2-3 hours after eating. WHAT FOODS ARE NOT RECOMMENDED? The following are some foods and drinks that may worsen your symptoms: Vegetables Tomatoes. Tomato juice. Tomato and spaghetti sauce. Chili peppers. Onion and garlic.  Horseradish. Fruits Oranges, grapefruit, and lemon (fruit and juice). Meats High-fat meats, fish, and poultry. This includes hot dogs, ribs, ham, sausage, salami, and bacon. Dairy Whole milk and chocolate milk. Sour cream. Cream. Butter. Ice cream. Cream cheese.  Beverages Coffee and tea, with or without caffeine. Carbonated beverages or energy drinks. Condiments Hot sauce. Barbecue sauce.  Sweets/Desserts Chocolate and cocoa. Donuts. Peppermint and spearmint. Fats and Oils High-fat foods, including [place] fries and potato chips. Other Vinegar. Strong spices, such as black pepper, white pepper, red pepper, cayenne, curry powder, cloves, ginger, and chili powder. The items listed above may not be a complete list of foods and beverages to avoid. Contact your dietitian for more information.   This information is not intended to replace advice given to you by your health care provider. Make sure you discuss any questions you have with your health care provider.   Document Released: 12/22/2004 Document Revised: 01/12/2014 Document Reviewed: 10/26/2012 Elsevier Interactive Patient Education 2016 [place].   Diabetes and Standards of Medical Care Diabetes is complicated. You may find that your diabetes team includes a dietitian, nurse, diabetes educator, eye doctor, and more. To help everyone know what is going on and to help you get the care you deserve, the following schedule of care was developed to help keep you on track. Below are the tests, exams, vaccines, medicines, education, and plans you will need. HbA1c test This test shows how well you have controlled your glucose over the past 2-3 months. It is used to see if your diabetes management plan needs to be adjusted.   It is performed at least 2 times a year if you are meeting treatment goals.  It is performed 4 times a year if therapy has changed or if you are not meeting treatment goals. Blood pressure test  This test is  performed at every routine medical visit. The goal is less than 140/90 mm Hg for most people, but 130/80 mm Hg in some cases. Ask your health care provider about your goal. Dental exam  Follow up with the dentist regularly. Eye exam  If you are diagnosed with type 1 diabetes as a child, get an exam upon reaching the age of 51 years or older and having had diabetes for 3-5 years. Yearly eye exams are recommended after that initial eye exam.  If you are diagnosed with type 1 diabetes as an adult, get an exam within 5 years of diagnosis and then yearly.  If you are diagnosed with type 2 diabetes, get an exam as soon as possible after the diagnosis and then yearly. Foot care exam  Visual foot exams are performed at every routine medical visit. The exams check for cuts, injuries, or other problems with the feet.  You should have a complete foot exam performed every year. This exam includes an inspection of the structure and skin of your feet, a check of the pulses in your feet, and a check of the sensation in your feet.  Type 1 diabetes: The first exam is performed 5 years after  diagnosis.  Type 2 diabetes: The first exam is performed at the time of diagnosis.  Check your feet nightly for cuts, injuries, or other problems with your feet. Tell your health care provider if anything is not healing. Kidney function test (urine microalbumin)  This test is performed once a year.  Type 1 diabetes: The first test is performed 5 years after diagnosis.  Type 2 diabetes: The first test is performed at the time of diagnosis.  A serum creatinine and estimated glomerular filtration rate (eGFR) test is done once a year to assess the level of chronic kidney disease (CKD), if present. Lipid profile (cholesterol, HDL, LDL, triglycerides)  Performed every 5 years for most people.  The goal for LDL is less than 100 mg/dL. If you are at high risk, the goal is less than 70 mg/dL.  The goal for HDL is 40  mg/dL-50 mg/dL for men and 50 AT/VV-33 mg/dL for women. An HDL cholesterol of 60 mg/dL or higher gives some protection against heart disease.  The goal for triglycerides is less than 150 mg/dL. Immunizations  The flu (influenza) vaccine is recommended yearly for every person 28 months of age or older who has diabetes.  The pneumonia (pneumococcal) vaccine is recommended for every person 70 years of age or older who has diabetes. Adults 41 years of age or older may receive the pneumonia vaccine as a series of two separate shots.  The hepatitis B vaccine is recommended for adults shortly after they have been diagnosed with diabetes.  The Tdap (tetanus, diphtheria, and pertussis) vaccine should be given:  According to normal childhood vaccination schedules, for children.  Every 10 years, for adults who have diabetes. Diabetes self-management education  Education is recommended at diagnosis and ongoing as needed. Treatment plan  Your treatment plan is reviewed at every medical visit.   This information is not intended to replace advice given to you by your health care provider. Make sure you discuss any questions you have with your health care provider.   Document Released: 10/19/2008 Document Revised: 01/12/2014 Document Reviewed: 05/24/2012 Elsevier Interactive Patient Education 2016 Elsevier Inc.  Stress and Stress Management Stress is a normal reaction to life events. It is what you feel when life demands more than you are used to or more than you can handle. Some stress can be useful. For example, the stress reaction can help you catch the last bus of the day, study for a test, or meet a deadline at work. But stress that occurs too often or for too long can cause problems. It can affect your emotional health and interfere with relationships and normal daily activities. Too much stress can weaken your immune system and increase your risk for physical illness. If you already have a medical  problem, stress can make it worse. CAUSES  All sorts of life events may cause stress. An event that causes stress for one person may not be stressful for another person. Major life events commonly cause stress. These may be positive or negative. Examples include losing your job, moving into a new home, getting married, having a baby, or losing a loved one. Less obvious life events may also cause stress, especially if they occur day after day or in combination. Examples include working long hours, driving in traffic, caring for children, being in debt, or being in a difficult relationship. SIGNS AND SYMPTOMS Stress may cause emotional symptoms including, the following:  Anxiety. This is feeling worried, afraid, on edge, overwhelmed, or out  of control.  Anger. This is feeling irritated or impatient.  Depression. This is feeling sad, down, helpless, or guilty.  Difficulty focusing, remembering, or making decisions. Stress may cause physical symptoms, including the following:   Aches and pains. These may affect your head, neck, back, stomach, or other areas of your body.  Tight muscles or clenched jaw.  Low energy or trouble sleeping. Stress may cause unhealthy behaviors, including the following:   Eating to feel better (overeating) or skipping meals.  Sleeping too little, too much, or both.  Working too much or putting off tasks (procrastination).  Smoking, drinking alcohol, or using drugs to feel better. DIAGNOSIS  Stress is diagnosed through an assessment by your health care provider. Your health care provider will ask questions about your symptoms and any stressful life events.Your health care provider will also ask about your medical history and may order blood tests or other tests. Certain medical conditions and medicine can cause physical symptoms similar to stress. Mental illness can cause emotional symptoms and unhealthy behaviors similar to stress. Your health care provider may  refer you to a mental health professional for further evaluation.  TREATMENT  Stress management is the recommended treatment for stress.The goals of stress management are reducing stressful life events and coping with stress in healthy ways.  Techniques for reducing stressful life events include the following:  Stress identification. Self-monitor for stress and identify what causes stress for you. These skills may help you to avoid some stressful events.  Time management. Set your priorities, keep a calendar of events, and learn to say "no." These tools can help you avoid making too many commitments. Techniques for coping with stress include the following:  Rethinking the problem. Try to think realistically about stressful events rather than ignoring them or overreacting. Try to find the positives in a stressful situation rather than focusing on the negatives.  Exercise. Physical exercise can release both physical and emotional tension. The key is to find a form of exercise you enjoy and do it regularly.  Relaxation techniques. These relax the body and mind. Examples include yoga, meditation, tai chi, biofeedback, deep breathing, progressive muscle relaxation, listening to music, being out in nature, journaling, and other hobbies. Again, the key is to find one or more that you enjoy and can do regularly.  Healthy lifestyle. Eat a balanced diet, get plenty of sleep, and do not smoke. Avoid using alcohol or drugs to relax.  Strong support network. Spend time with family, friends, or other people you enjoy being around.Express your feelings and talk things over with someone you trust. Counseling or talktherapy with a mental health professional may be helpful if you are having difficulty managing stress on your own. Medicine is typically not recommended for the treatment of stress.Talk to your health care provider if you think you need medicine for symptoms of stress. HOME CARE  INSTRUCTIONS  Keep all follow-up visits as directed by your health care provider.  Take all medicines as directed by your health care provider. SEEK MEDICAL CARE IF:  Your symptoms get worse or you start having new symptoms.  You feel overwhelmed by your problems and can no longer manage them on your own. SEEK IMMEDIATE MEDICAL CARE IF:  You feel like hurting yourself or someone else.   This information is not intended to replace advice given to you by your health care provider. Make sure you discuss any questions you have with your health care provider.   Document Released: 06/17/2000 Document  Revised: 01/12/2014 Document Reviewed: 08/16/2012 Elsevier Interactive Patient Education [employer].       I personally performed the services described in this documentation, which was scribed in my presence. The recorded information has been reviewed and considered, and addended by me as needed.

## 2015-03-31 LAB — LIPID PANEL
CHOL/HDL RATIO: 6.4 ratio — AB (ref <=5.0)
CHOLESTEROL: 257 mg/dL — AB (ref 125–200)
HDL: 40 mg/dL (ref >=40)
LDL CALC: 192 mg/dL — AB (ref <130)
TRIGLYCERIDES: 124 mg/dL (ref <150)
VLDL: 25 mg/dL (ref <30)

## 2015-03-31 LAB — COMPLETE METABOLIC PANEL WITH GFR
ALT: 37 U/L (ref 9–46)
AST: 25 U/L (ref 10–40)
Albumin: 4.3 g/dL (ref 3.6–5.1)
Alkaline Phosphatase: 59 U/L (ref 40–115)
BUN: 9 mg/dL (ref 7–25)
CALCIUM: 9.5 mg/dL (ref 8.6–10.3)
CHLORIDE: 107 mmol/L (ref 98–110)
CO2: 27 mmol/L (ref 20–31)
Creat: 1.11 mg/dL (ref 0.60–1.35)
GFR, EST NON AFRICAN AMERICAN: 80 mL/min (ref >=60)
Glucose, Bld: 94 mg/dL (ref 65–99)
POTASSIUM: 4.3 mmol/L (ref 3.5–5.3)
Sodium: 140 mmol/L (ref 135–146)
Total Bilirubin: 0.6 mg/dL (ref 0.2–1.2)
Total Protein: 7.5 g/dL (ref 6.1–8.1)

## 2015-04-02 LAB — MICROALBUMIN, URINE: MICROALB UR: 0.8 mg/dL

## 2015-06-20 ENCOUNTER — Encounter: Payer: Self-pay | Admitting: Physician Assistant

## 2015-06-20 ENCOUNTER — Telehealth: Payer: Self-pay

## 2015-06-20 DIAGNOSIS — Z86010 Personal history of colonic polyps: Secondary | ICD-10-CM | POA: Insufficient documentation

## 2015-06-20 DIAGNOSIS — E119 Type 2 diabetes mellitus without complications: Secondary | ICD-10-CM | POA: Insufficient documentation

## 2015-06-20 NOTE — Telephone Encounter (Signed)
The paper record (last visit was with me 10/25/2008) and the forms were in my box.  [facility]. At the visit 05/25/2014, for a CPE, he received a Tdap.  There are no other vaccinations documented in our records.  His previous PCP was Dr. Posey ReaPlotnikov [facility]([place]) and they may have other immunization history on him.  I will return the paper chart and forms to the nurses' box.

## 2015-06-20 NOTE — Telephone Encounter (Signed)
Pt dropped off a form to be filled out concerning his Immunizations for [facility]. Please advise at [phone]

## 2015-06-20 NOTE — Telephone Encounter (Signed)
Clinical, see the message below.  The message was accidentally routed to the referrals pool first.

## 2015-06-21 NOTE — Telephone Encounter (Signed)
Left message with information. Chart still in nurses box.

## 2015-06-25 ENCOUNTER — Ambulatory Visit (INDEPENDENT_AMBULATORY_CARE_PROVIDER_SITE_OTHER): Payer: Self-pay | Admitting: Physician Assistant

## 2015-06-25 VITALS — BP 142/80 | HR 79 | Temp 98.2°F | Resp 15 | Ht 69.5 in | Wt 221.0 lb

## 2015-06-25 DIAGNOSIS — Z0184 Encounter for antibody response examination: Secondary | ICD-10-CM

## 2015-06-25 NOTE — Patient Instructions (Addendum)
We will let you know when a copy of your MMR titer is ready for pick up. If you are not immune you will need to start series over again. Establish care with provider when you move to [facility]    IF you received an x-ray today, you will receive an invoice from [facility]. Please contact [facility] at [phone] with questions or concerns regarding your invoice.   IF you received labwork today, you will receive an invoice from [employer]. Please contact [facility] at [phone] with questions or concerns regarding your invoice.   Our billing staff will not be able to assist you with questions regarding bills from these companies.  You will be contacted with the lab results as soon as they are available. The fastest way to get your results is to activate your My Chart account. Instructions are located on the last page of this paperwork. If you have not heard from [place] regarding the results in 2 weeks, please contact this office.

## 2015-06-25 NOTE — Progress Notes (Signed)
[facility] [address], [place] [address] [phone]  Date:  06/25/2015   Name:  Andre Castro   DOB:  [birth year]   MRN:  [id number]  PCP:  No PCP Per Patient    Chief Complaint: Immunizations   History of Present Illness:  This is a 45 y.o. male who is presenting needing MMR titer. He got into [employer]. He is needing an immunization form filled out. Per his records he only got one MMR as a child so he is needing a titer to determine if he needs to start the series over again. He is otherwise up to date on immunizations.  He is moving to [facility] soon and will be getting insurance through school. He is planning to establish care with a provider in [place].  Review of Systems:  Review of Systems See HPI  Patient Active Problem List   Diagnosis Date Noted  . Diabetes mellitus type 2, uncomplicated ([place]) 99/83/3825  . History of colonic polyps 06/20/2015  . Rectal bleeding 05/25/2014    Prior to Admission medications   Medication Sig Start Date End Date Taking? Authorizing Provider  ibuprofen (ADVIL,MOTRIN) 200 MG tablet Take 200 mg by mouth every 6 (six) hours as needed.   Yes Historical Provider, MD  omeprazole (PRILOSEC) 20 MG capsule Take 1 capsule (20 mg total) by mouth daily. Patient not taking: Reported on 06/25/2015 03/30/15   Wendie Agreste, MD    No Known Allergies  Past Surgical History  Procedure Laterality Date  . Wisdom tooth extraction    . Knee arthroscopy      Social History  Substance Use Topics  . Smoking status: Former Smoker    Quit date: 12/05/2001  . Smokeless tobacco: Never Used  . Alcohol Use: No    Family History  Problem Relation Age of Onset  . Hypertension Mother   . Stroke Maternal Grandmother   . Cancer Maternal Grandfather   . Cancer Paternal Grandmother   . Stroke Paternal Grandmother     Medication list has been reviewed and updated.  Physical Examination:  Physical  Exam  Constitutional: He is oriented to person, place, and time. He appears well-developed and well-nourished. No distress.  HENT:  Head: Normocephalic and atraumatic.  Right Ear: Hearing normal.  Left Ear: Hearing normal.  Nose: Nose normal.  Eyes: Conjunctivae and lids are normal. Right eye exhibits no discharge. Left eye exhibits no discharge. No scleral icterus.  Pulmonary/Chest: Effort normal. No respiratory distress.  Musculoskeletal: Normal range of motion.  Neurological: He is alert and oriented to person, place, and time.  Skin: Skin is warm, dry and intact. No lesion and no rash noted.  Psychiatric: He has a normal mood and affect. His speech is normal and behavior is normal. Thought content normal.    BP 154/90 mmHg  Pulse 79  Temp(Src) 98.2 F (36.8 C) (Oral)  Resp 15  Ht 5' 9.5" (1.765 m)  Wt 221 lb (100.245 kg)  BMI 32.18 kg/m2  SpO2 98%  Assessment and Plan:  1. Immunity status testing - Measles/Mumps/Rubella Immunity   Benjaman Pott. Drenda Freeze, MHS [facility]  06/25/2015

## 2015-06-26 LAB — MEASLES/MUMPS/RUBELLA IMMUNITY
MUMPS IGG: 30.4 [AU]/ml — AB (ref <9.00)
RUBELLA: 2.31 {index} — AB (ref <0.90)
RUBEOLA IGG: 194 [AU]/ml — AB (ref <25.00)

## 2016-07-13 ENCOUNTER — Ambulatory Visit: Payer: Self-pay | Admitting: Family Medicine

## 2016-07-13 ENCOUNTER — Ambulatory Visit (HOSPITAL_COMMUNITY)
Admission: EM | Admit: 2016-07-13 | Discharge: 2016-07-13 | Disposition: A | Discharge status: Home / Self Care | Payer: BLUE CROSS/BLUE SHIELD | Attending: Internal Medicine | Admitting: Internal Medicine

## 2016-07-13 ENCOUNTER — Encounter (HOSPITAL_COMMUNITY): Payer: Self-pay | Admitting: Emergency Medicine

## 2016-07-13 DIAGNOSIS — R51 Headache: Secondary | ICD-10-CM

## 2016-07-13 DIAGNOSIS — R519 Headache, unspecified: Secondary | ICD-10-CM

## 2016-07-13 NOTE — ED Triage Notes (Addendum)
[facility] a [facility] of off and on headaches x 1 week. The patient denied any pain at this time.

## 2016-07-13 NOTE — Discharge Instructions (Signed)
For your headaches, if at any time you have unilateral weakness, a "thunderclap" headache, worst headache of your life, or headache in the presence of a fever, go to the emergency room. Otherwise I recommend rest, plenty of fluid, abstaining from caffeine products, Tylenol or Motrin or Naprosyn for pain management, I have included the contact information for community health and wellness, contact them to set up an appointment to establish for primary care.

## 2016-07-14 NOTE — ED Provider Notes (Signed)
CSN: [id number]     Arrival date & time 07/13/16  1943 History   None    Chief Complaint  Patient presents with  . Headache   (Consider location/radiation/quality/duration/timing/severity/associated sxs/prior Treatment) Andre Castro is a 46 y.o. male with past hx of DMII who [facility] with a chief complaint of Headache for one week, not currently experiencing any symptoms and is pain free.    The history is provided by the patient.  Headache  Pain location:  L parietal and L temporal Radiates to:  Does not radiate Severity currently:  0/10 Severity at highest:  8/10 Onset quality:  Gradual Duration:  5 days Timing:  Intermittent Progression:  Resolved Chronicity:  New Similar to prior headaches: no   Context: emotional stress   Context: not activity, not caffeine, not coughing, not eating, not intercourse and not straining   Relieved by:  Acetaminophen and NSAIDs Worsened by:  Nothing Associated symptoms: no blurred vision, no congestion, no dizziness, no ear pain, no fever, no nausea, no near-syncope, no neck pain, no neck stiffness, no numbness, no photophobia, no sinus pressure, no URI, no visual change, no vomiting and no weakness     Past Medical History:  Diagnosis Date  . Allergy   . Diabetes mellitus without complication [facility](HCC)    Past Surgical History:  Procedure Laterality Date  . KNEE ARTHROSCOPY    . WISDOM TOOTH EXTRACTION     Family History  Problem Relation Age of Onset  . Hypertension Mother   . Stroke Maternal Grandmother   . Cancer Maternal Grandfather   . Cancer Paternal Grandmother   . Stroke Paternal Grandmother    Social History  Substance Use Topics  . Smoking status: Former Smoker    Quit date: 12/05/2001  . Smokeless tobacco: Never Used  . Alcohol use No    Review of Systems  Constitutional: Negative for chills and fever.  HENT: Negative for congestion, ear pain and sinus pressure.   Eyes: Negative  for blurred vision and photophobia.  Respiratory: Negative.   Cardiovascular: Negative.  Negative for near-syncope.  Gastrointestinal: Negative for nausea and vomiting.  Musculoskeletal: Negative for neck pain and neck stiffness.  Skin: Negative.   Neurological: Positive for headaches. Negative for dizziness, weakness and numbness.    Allergies  Patient has no known allergies.  Home Medications   Prior to Admission medications   Not on File   Meds Ordered and Administered this Visit  Medications - No data to display  BP (!) 169/100 (BP Location: Right Arm)   Pulse 96   Temp 98.8 F (37.1 C) (Oral)   Resp 18   SpO2 99%  No data found.   Physical Exam  Constitutional: He is oriented to person, place, and time. He appears well-developed and well-nourished. No distress.  HENT:  Head: Normocephalic and atraumatic.  Right Ear: Tympanic membrane and external ear normal.  Left Ear: Tympanic membrane and external ear normal.  Nose: Nose normal.  Mouth/Throat: Oropharynx is clear and moist.  Eyes: Conjunctivae and lids are normal. Pupils are equal, round, and reactive to light.  Fundoscopic exam:      The right eye shows no papilledema.       The left eye shows no papilledema.  Neck: Normal range of motion.  Cardiovascular: Normal rate and regular rhythm.   Pulmonary/Chest: Effort normal and breath sounds normal.  Lymphadenopathy:    He has no cervical adenopathy.  Neurological: He  is alert and oriented to person, place, and time. He displays normal reflexes. No cranial nerve deficit.  Skin: Skin is warm and dry. Capillary refill takes less than 2 seconds. He is not diaphoretic.  Psychiatric: He has a normal mood and affect. His behavior is normal.  Nursing note and vitals reviewed.   Urgent Care Course     Procedures (including critical care time)  Labs Review Labs Reviewed - No data to display  Imaging Review No results found.    MDM   1. Acute  nonintractable headache, unspecified headache type    Not currently experiencing any symptoms, provided counseling on over-the-counter therapies for symptom management, provided counseling on warning signs to seek further treatment at the emergency room, provided contact information for community health and wellness, encouraged to follow up with him to establish for primary care.     Dorena Bodo, NP 07/14/16 [phone]

## 2017-02-27 IMAGING — CR DG CHEST 2V
2 series · 2 of 2 positions shown · non-contrast
Comparison: None.

CLINICAL DATA: 43-year-old male with a history of shortness of
breath

EXAM:
CHEST - 2 VIEW

[PA]
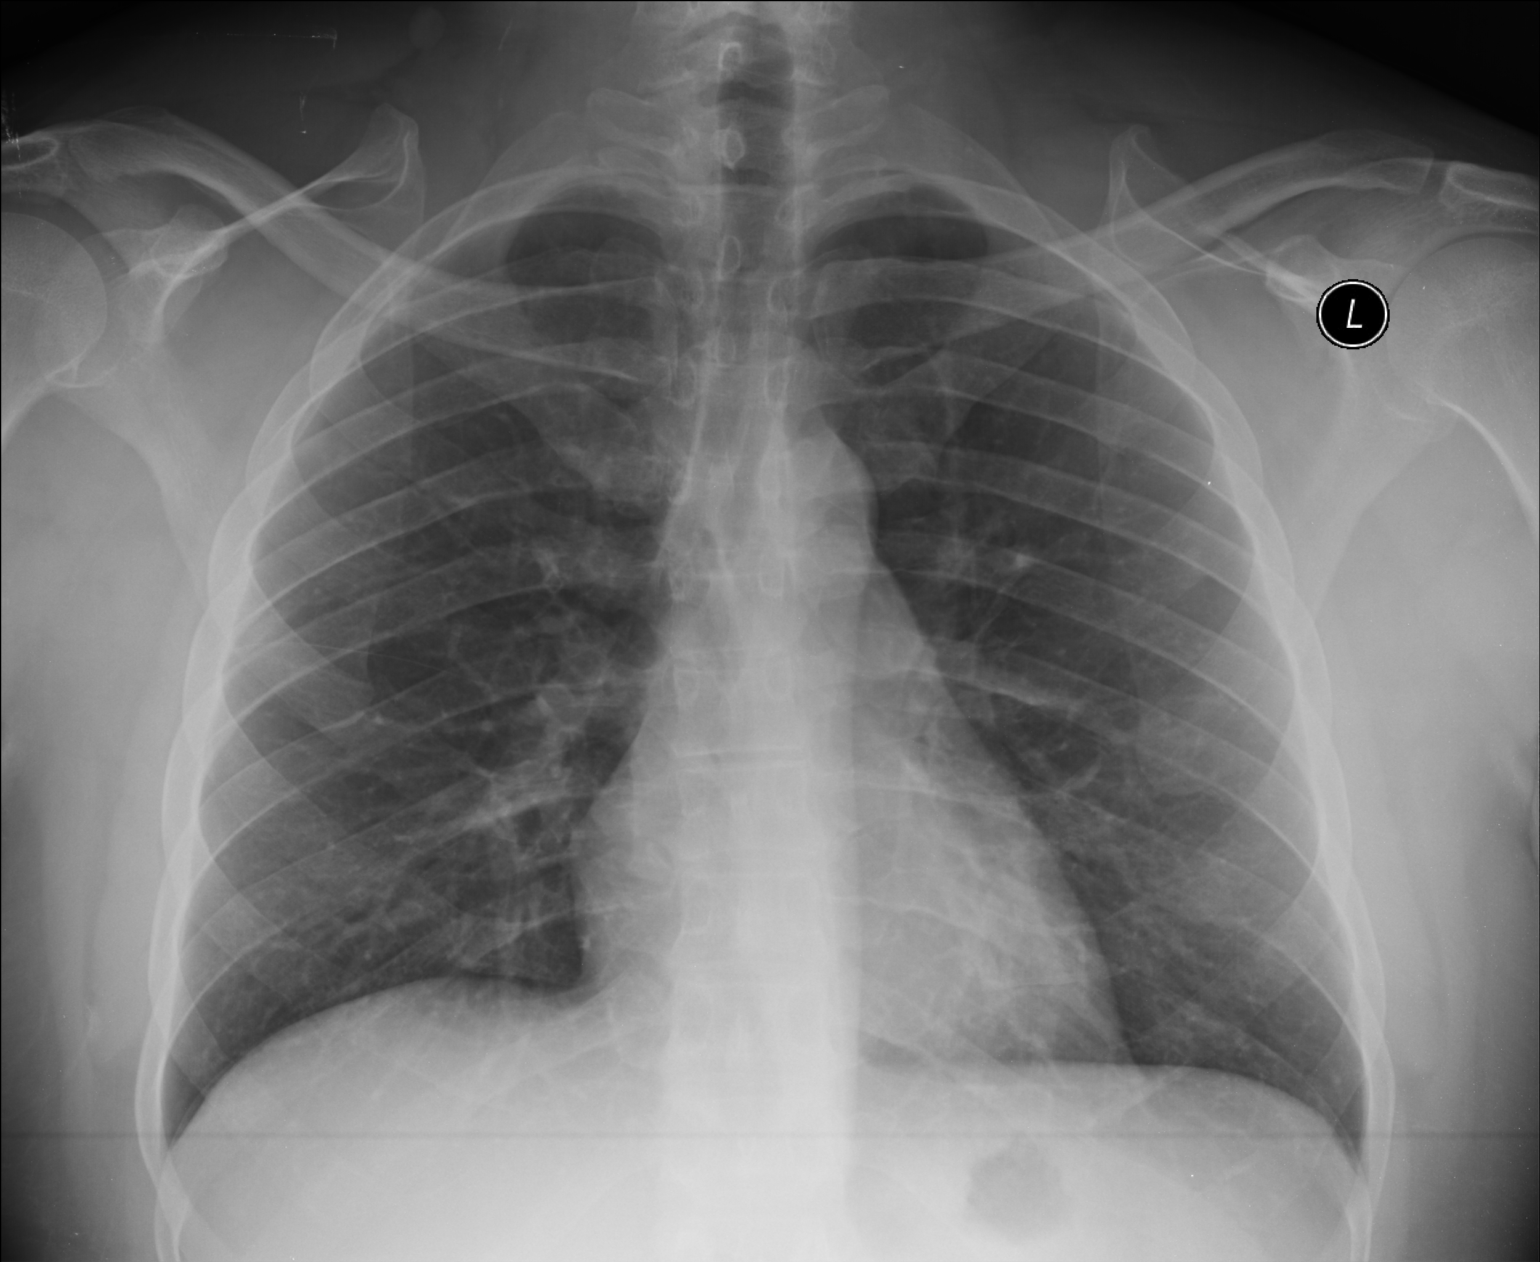

[lateral]
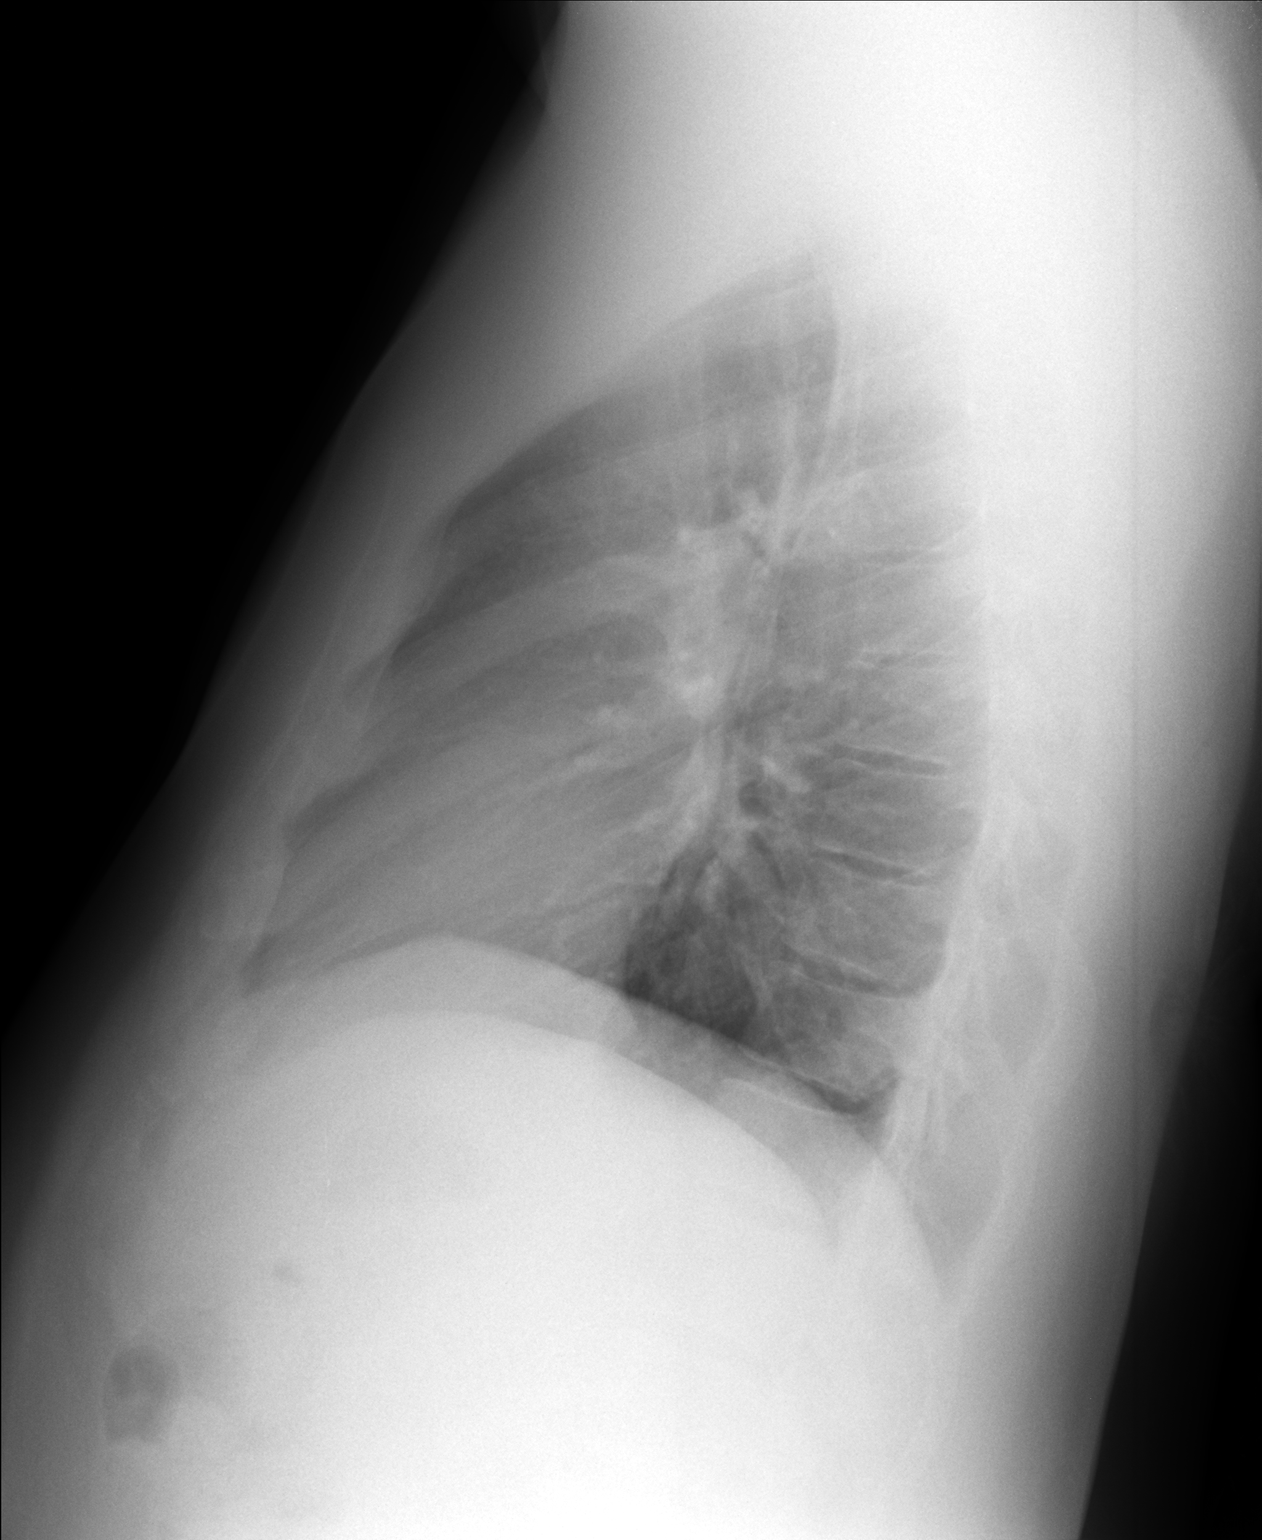

[2 of 2 positions shown; findings below may reference images not displayed]

FINDINGS: Cardiomediastinal silhouette projects within normal limits in size
and contour. No confluent airspace disease, pneumothorax, or pleural
effusion.

No displaced fracture.

Unremarkable appearance of the upper abdomen.
IMPRESSION: No radiographic evidence of acute cardiopulmonary disease.

## 2018-03-09 ENCOUNTER — Encounter (HOSPITAL_COMMUNITY): Payer: Self-pay | Admitting: Emergency Medicine

## 2018-03-09 ENCOUNTER — Other Ambulatory Visit: Payer: Self-pay

## 2018-03-09 ENCOUNTER — Ambulatory Visit (HOSPITAL_COMMUNITY)
Admission: EM | Admit: 2018-03-09 | Discharge: 2018-03-09 | Disposition: A | Discharge status: Home / Self Care | Payer: BLUE CROSS/BLUE SHIELD | Attending: Family Medicine | Admitting: Family Medicine

## 2018-03-09 DIAGNOSIS — B9789 Other viral agents as the cause of diseases classified elsewhere: Secondary | ICD-10-CM

## 2018-03-09 DIAGNOSIS — J069 Acute upper respiratory infection, unspecified: Secondary | ICD-10-CM | POA: Diagnosis not present

## 2018-03-09 MED ORDER — FLUTICASONE PROPIONATE 50 MCG/ACT NA SUSP
2.0000 | Freq: Every day | NASAL | 0 refills | Status: DC
Start: 2018-03-09 — End: 2020-11-12

## 2018-03-09 MED ORDER — BENZONATATE 100 MG PO CAPS: 100.0000 mg | ORAL_CAPSULE | Freq: Three times a day (TID) | ORAL | 0 refills | Status: DC

## 2018-03-09 MED ORDER — CETIRIZINE HCL 10 MG PO TABS
10.0000 mg | ORAL_TABLET | Freq: Every day | ORAL | 0 refills | Status: DC
Start: 2018-03-09 — End: 2022-02-02

## 2018-03-09 NOTE — ED Provider Notes (Signed)
[facility]   [id number] 03/09/18 Arrival Time: 1936   CC: URI symptoms   SUBJECTIVE: History from: patient  Andre Castro is a 48 y.o. male who presents with productive cough, runny nose, and fatigue x 1 week.  Admits to sick exposure to roommates with similar symptoms.  Has tried OTC medications without relief.  Reports previous symptoms in the past.   Denies fever, chills, sinus pain, sore throat, SOB, wheezing, chest pain, nausea, changes in bowel or bladder habits.    Received flu shot this year: no.  ROS: As per HPI.  Past Medical History:  Diagnosis Date  . Allergy   . Diabetes mellitus without complication [facility])    Past Surgical History:  Procedure Laterality Date  . WISDOM TOOTH EXTRACTION     No Known Allergies No current facility-administered medications on file prior to encounter.    Current Outpatient Medications on File Prior to Encounter  Medication Sig Dispense Refill  . guaiFENesin (MUCINEX) 600 MG 12 hr tablet Take by mouth 2 (two) times daily.     Social History   Socioeconomic History  . Marital status: Single    Spouse name: Not on file  . Number of children: Not on file  . Years of education: 58  . Highest education level: Not on file  Occupational History  . Occupation: Nurse, mental health  Social Needs  . Financial resource strain: Not on file  . Food insecurity:    Worry: Not on file    Inability: Not on file  . Transportation needs:    Medical: Not on file    Non-medical: Not on file  Tobacco Use  . Smoking status: Former Smoker    Last attempt to quit: 12/05/2001    Years since quitting: 16.2  . Smokeless tobacco: Never Used  Substance and Sexual Activity  . Alcohol use: No    Alcohol/week: 0.0 standard drinks  . Drug use: No  . Sexual activity: Never  Lifestyle  . Physical activity:    Days per week: Not on file    Minutes per session: Not on file  . Stress: Not on file  Relationships  . Social connections:   Talks on phone: Not on file    Gets together: Not on file    Attends religious service: Not on file    Active member of club or organization: Not on file    Attends meetings of clubs or organizations: Not on file    Relationship status: Not on file  . Intimate partner violence:    Fear of current or ex partner: Not on file    Emotionally abused: Not on file    Physically abused: Not on file    Forced sexual activity: Not on file  Other Topics Concern  . Not on file  Social History Narrative  . Not on file   Family History  Problem Relation Age of Onset  . Hypertension Mother   . Stroke Maternal Grandmother   . Cancer Maternal Grandfather   . Cancer Paternal Grandmother   . Stroke Paternal Grandmother     OBJECTIVE:  Vitals:   03/09/18 2019 03/09/18 2021  BP: (!) 166/107 (!) 149/96  Pulse: 89   Resp: 18   Temp: 98.2 F (36.8 C)   TempSrc: Temporal   SpO2: 99%      General appearance: alert; appears mildly fatigued, but nontoxic; speaking in full sentences and tolerating own secretions HEENT: NCAT; Ears: EACs clear, TMs pearly gray; Eyes:  PERRL.  EOM grossly intact. Sinuses: nontender; Nose: nares patent without rhinorrhea, turbinates boggy, Throat: oropharynx clear, tonsils non erythematous or enlarged, uvula midline  Neck: supple without LAD Lungs: unlabored respirations, symmetrical air entry; cough: absent; no respiratory distress; CTAB Heart: regular rate and rhythm.  Radial pulses 2+ symmetrical bilaterally Skin: warm and dry Psychological: alert and cooperative; normal mood and affect  ASSESSMENT & PLAN:  1. Viral URI with cough     Meds ordered this encounter  Medications  . cetirizine (ZYRTEC) 10 MG tablet    Sig: Take 1 tablet (10 mg total) by mouth daily.    Dispense:  30 tablet    Refill:  0    Order Specific Question:   Supervising Provider    Answer:   Eustace Moore [[id number]]  . fluticasone (FLONASE) 50 MCG/ACT nasal spray    Sig: Place 2  sprays into both nostrils daily.    Dispense:  16 g    Refill:  0    Order Specific Question:   Supervising Provider    Answer:   Eustace Moore [[id number]]  . benzonatate (TESSALON) 100 MG capsule    Sig: Take 1 capsule (100 mg total) by mouth every 8 (eight) hours.    Dispense:  21 capsule    Refill:  0    Order Specific Question:   Supervising Provider    Answer:   Eustace Moore [[id number]]    Get plenty of rest and push fluids Tessalon Perles prescribed for cough Zyrtec prescribed for nasal congestion, runny nose, and/or sore throat Flonase prescribed for nasal congestion and runny nose Use medications daily for symptom relief Use OTC medications like ibuprofen or tylenol as needed fever or pain Follow up with PCP or with Joaquin Courts FNP, if you do not already have a PCP, if symptoms persist Return or go to ER if you have any new or worsening symptoms fever, chills, nausea, vomiting, chest pain, cough, shortness of breath, wheezing, abdominal pain, changes in bowel or bladder habits, etc...  Reviewed expectations re: course of current medical issues. Questions answered. Outlined signs and symptoms indicating need for more acute intervention. Patient verbalized understanding. After Visit Summary given.         Rennis Harding, PA-C 03/09/18 2105

## 2018-03-09 NOTE — ED Triage Notes (Signed)
Deep cough, congested cough.  Cough started last week.

## 2018-03-09 NOTE — Discharge Instructions (Signed)
Get plenty of rest and push fluids Tessalon Perles prescribed for cough Zyrtec prescribed for nasal congestion, runny nose, and/or sore throat Flonase prescribed for nasal congestion and runny nose Use medications daily for symptom relief Use OTC medications like ibuprofen or tylenol as needed fever or pain Follow up with PCP or with Joaquin Courts FNP, if you do not already have a PCP, if symptoms persist Return or go to ER if you have any new or worsening symptoms fever, chills, nausea, vomiting, chest pain, cough, shortness of breath, wheezing, abdominal pain, changes in bowel or bladder habits, etc..Marland Kitchen

## 2020-11-12 ENCOUNTER — Ambulatory Visit (HOSPITAL_COMMUNITY)
Admission: EM | Admit: 2020-11-12 | Discharge: 2020-11-12 | Disposition: A | Discharge status: Home / Self Care | Payer: BC Managed Care – PPO | Attending: Emergency Medicine | Admitting: Emergency Medicine

## 2020-11-12 ENCOUNTER — Other Ambulatory Visit: Payer: Self-pay

## 2020-11-12 DIAGNOSIS — B349 Viral infection, unspecified: Secondary | ICD-10-CM

## 2020-11-12 MED ORDER — GUAIFENESIN ER 600 MG PO TB12: 600.0000 mg | ORAL_TABLET | Freq: Two times a day (BID) | ORAL | 0 refills | Status: DC

## 2020-11-12 MED ORDER — FLUTICASONE PROPIONATE 50 MCG/ACT NA SUSP: 2.0000 | Freq: Every day | NASAL | 0 refills | Status: DC

## 2020-11-12 NOTE — Discharge Instructions (Addendum)
You can take Tylenol and/or Ibuprofen as needed for fever reduction and pain relief.   For cough: honey 1/2 to 1 teaspoon (you can dilute the honey in water or another fluid).  You can also use guaifenesin and dextromethorphan for cough. You can use a humidifier for chest congestion and cough.  If you don't have a humidifier, you can sit in the bathroom with the hot shower running.      For sore throat: try warm salt water gargles, cepacol lozenges, throat spray, warm tea or water with lemon/honey, popsicles or ice, or OTC cold relief medicine for throat discomfort.   For congestion: take a daily anti-histamine like Zyrtec, Claritin, and a oral decongestant, such as pseudoephedrine.  You can also use Flonase 1-2 sprays in each nostril daily.   It is important to stay hydrated: drink plenty of fluids (water, gatorade/powerade/pedialyte, juices, or teas) to keep your throat moisturized and help further relieve irritation/discomfort.   

## 2020-11-12 NOTE — ED Triage Notes (Signed)
Pt reports productive cough, congestion and sore throat x3 days.

## 2020-11-12 NOTE — ED Provider Notes (Signed)
[facility]    CSN: [id number] Arrival date & time: 11/12/20  9983      History   Chief Complaint Chief Complaint  Patient presents with   Cough   Sore Throat   Nasal Congestion    HPI Andre Castro is a 50 y.o. male.   Patient presents with nasal congestion, rhinorrhea, sore throat productive cough for 3 days.  Poor appetite but tolerating fluids.  No known sick contacts.  Has attempted use of Mucinex with minimal relief.  Denies fevers, chills, body aches, shortness of breath, wheezing, abdominal pain, nausea, vomiting, diarrhea.  History of type 2 diabetes, seasonal allergies.   Past Medical History:  Diagnosis Date   Allergy    Diabetes mellitus without complication [facility])     Patient Active Problem List   Diagnosis Date Noted   Diabetes mellitus type 2, uncomplicated (HCC) 06/20/2015   History of colonic polyps 06/20/2015   Rectal bleeding 05/25/2014    Past Surgical History:  Procedure Laterality Date   WISDOM TOOTH EXTRACTION         Home Medications    Prior to Admission medications   Medication Sig Start Date End Date Taking? Authorizing Provider  benzonatate (TESSALON) 100 MG capsule Take 1 capsule (100 mg total) by mouth every 8 (eight) hours. 03/09/18   Wurst, [place], PA-C  cetirizine (ZYRTEC) 10 MG tablet Take 1 tablet (10 mg total) by mouth daily. 03/09/18   Wurst, [place], PA-C  fluticasone (FLONASE) 50 MCG/ACT nasal spray Place 2 sprays into both nostrils daily. 03/09/18   Wurst, [place], PA-C  guaiFENesin (MUCINEX) 600 MG 12 hr tablet Take by mouth 2 (two) times daily.    [provider]    Family History Family History  Problem Relation Age of Onset   Hypertension Mother    Stroke Maternal Grandmother    Cancer Maternal Grandfather    Cancer Paternal Grandmother    Stroke Paternal Grandmother     Social History Social History   Tobacco Use   Smoking status: Former    Types: Cigarettes    Quit date:  12/05/2001    Years since quitting: 18.9   Smokeless tobacco: Never  Substance Use Topics   Alcohol use: No    Alcohol/week: 0.0 standard drinks   Drug use: No     Allergies   Patient has no known allergies.   Review of Systems Review of Systems  Constitutional: Negative.   HENT:  Positive for congestion, postnasal drip, rhinorrhea and sore throat. Negative for dental problem, drooling, ear discharge, ear pain, facial swelling, hearing loss, mouth sores, nosebleeds, sinus pressure, sinus pain, sneezing, tinnitus, trouble swallowing and voice change.   Respiratory:  Positive for cough. Negative for apnea, choking, chest tightness, shortness of breath, wheezing and stridor.   Gastrointestinal: Negative.   Skin: Negative.   Neurological: Negative.     Physical Exam Triage Vital Signs ED Triage Vitals  Enc Vitals Group     BP 11/12/20 1047 (!) 168/122     Pulse Rate 11/12/20 1047 95     Resp 11/12/20 1047 18     Temp 11/12/20 1047 98.5 F (36.9 C)     Temp src --      SpO2 11/12/20 1047 98 %     Weight --      Height --      Head Circumference --      Peak Flow --      Pain Score 11/12/20 1046  0     Pain Loc --      Pain Edu? --      Excl. in GC? --    No data found.  Updated Vital Signs BP (!) 168/122 (BP Location: Right Arm)   Pulse 95   Temp 98.5 F (36.9 C)   Resp 18   SpO2 98%   Visual Acuity Right Eye Distance:   Left Eye Distance:   Bilateral Distance:    Right Eye Near:   Left Eye Near:    Bilateral Near:     Physical Exam Constitutional:      Appearance: Normal appearance. He is normal weight.  HENT:     Head: Normocephalic.     Right Ear: Tympanic membrane, ear canal and external ear normal.     Left Ear: Tympanic membrane, ear canal and external ear normal.     Nose: Congestion present. No rhinorrhea.     Mouth/Throat:     Mouth: Mucous membranes are moist.     Pharynx: Posterior oropharyngeal erythema present.  Eyes:     Extraocular  Movements: Extraocular movements intact.  Cardiovascular:     Rate and Rhythm: Normal rate and regular rhythm.     Pulses: Normal pulses.     Heart sounds: Normal heart sounds.  Pulmonary:     Effort: Pulmonary effort is normal.     Breath sounds: Normal breath sounds.  Skin:    General: Skin is warm and dry.  Neurological:     Mental Status: He is alert and oriented to person, place, and time. Mental status is at baseline.  Psychiatric:        Mood and Affect: Mood normal.        Behavior: Behavior normal.     UC Treatments / Results  Labs (all labs ordered are listed, but only abnormal results are displayed) Labs Reviewed - No data to display  EKG   Radiology No results found.  Procedures Procedures (including critical care time)  Medications Ordered in UC Medications - No data to display  Initial Impression / Assessment and Plan / UC Course  I have reviewed the triage vital signs and the nursing notes.  Pertinent labs & imaging results that were available during my care of the patient were reviewed by me and considered in my medical decision making (see chart for details).  Viral illness  Discussed etiology of symptoms, timeline and possible resolution with patient  1.  Mucinex 600 mg twice daily 2.  Flonase 50 mcg 1 spray each nare daily 3.  Over-the-counter medications for remaining symptom management 4.  Urgent care follow-up as needed Final Clinical Impressions(s) / UC Diagnoses   Final diagnoses:  None   Discharge Instructions   None    ED Prescriptions   None    PDMP not reviewed this encounter.   Valinda Hoar, NP 11/13/20 [phone]

## 2021-09-25 ENCOUNTER — Other Ambulatory Visit: Payer: Self-pay

## 2021-09-25 ENCOUNTER — Emergency Department (HOSPITAL_COMMUNITY)
Admission: EM | Admit: 2021-09-25 | Discharge: 2021-09-25 | Disposition: A | Discharge status: Home / Self Care | Payer: BC Managed Care – PPO | Attending: Emergency Medicine | Admitting: Emergency Medicine

## 2021-09-25 ENCOUNTER — Encounter (HOSPITAL_COMMUNITY): Payer: Self-pay

## 2021-09-25 DIAGNOSIS — R112 Nausea with vomiting, unspecified: Secondary | ICD-10-CM | POA: Diagnosis present

## 2021-09-25 DIAGNOSIS — K645 Perianal venous thrombosis: Secondary | ICD-10-CM | POA: Diagnosis not present

## 2021-09-25 HISTORY — DX: Unspecified hemorrhoids: K64.9

## 2021-09-25 MED ORDER — HYDROCORTISONE ACETATE 25 MG RE SUPP: 25.0000 mg | Freq: Two times a day (BID) | RECTAL | 0 refills | Status: DC

## 2021-09-25 MED ORDER — CHLORTHALIDONE 25 MG PO TABS: 25.0000 mg | ORAL_TABLET | Freq: Every day | ORAL | 2 refills | Status: DC

## 2021-09-25 MED ORDER — LIDOCAINE HCL (PF) 1 % IJ SOLN
30.0000 mL | Freq: Once | INTRAMUSCULAR | Status: AC
Start: 2021-09-25 — End: 2021-09-25
  Administered 2021-09-25: 30 mL via INTRADERMAL
  Filled 2021-09-25: qty 30

## 2021-09-25 MED ORDER — ONDANSETRON 4 MG PO TBDP
4.0000 mg | ORAL_TABLET | Freq: Once | ORAL | Status: AC
  Administered 2021-09-25: 4 mg via ORAL
  Filled 2021-09-25: qty 1

## 2021-09-25 MED ORDER — CHLORTHALIDONE 25 MG PO TABS
25.0000 mg | ORAL_TABLET | Freq: Once | ORAL | Status: AC
  Administered 2021-09-25: 25 mg via ORAL
  Filled 2021-09-25: qty 1

## 2021-09-25 MED ORDER — HYDROCORTISONE ACETATE 25 MG RE SUPP
25.0000 mg | Freq: Once | RECTAL | Status: AC
Start: 2021-09-25 — End: 2021-09-25
  Administered 2021-09-25: 25 mg via RECTAL
  Filled 2021-09-25: qty 1

## 2021-09-25 MED ORDER — LIDOCAINE 4 % EX CREA
TOPICAL_CREAM | Freq: Once | CUTANEOUS | Status: AC
  Administered 2021-09-25: 1 via TOPICAL
  Filled 2021-09-25: qty 5

## 2021-09-25 NOTE — ED Provider Notes (Signed)
[facility]ENC[facility] DEPT Provider Note   CSN: [id number] Arrival date & time: 09/25/21  0735     History  Chief Complaint  Patient presents with   Emesis   Diarrhea   Rectal Bleeding    Andre Castro is a 51 y.o. male.  HPI Patient presents with concern of the pain, nausea.  He notes that he has had hemorrhoids for some time, and typically he is able to reduce them.  However, after a recent GI-like illness, with nausea, vomiting, diarrhea he has been unable to reduce hemorrhoids and has increasing pain in this area.  No fever, no abdominal pain, no current vomiting.  He notes that he is otherwise generally well, though acknowledges not taking his blood pressure medication as previously prescribed.  He has not seen a surgeon for his hemorrhoids, and has had minimal relief with witch hazel pads.    Home Medications Prior to Admission medications   Medication Sig Start Date End Date Taking? Authorizing Provider  chlorthalidone (HYGROTON) 25 MG tablet Take 1 tablet (25 mg total) by mouth daily. 09/25/21  Yes Gerhard Munch, MD  hydrocortisone (ANUSOL-HC) 25 MG suppository Place 1 suppository (25 mg total) rectally 2 (two) times daily. 09/25/21  Yes Gerhard Munch, MD  benzonatate (TESSALON) 100 MG capsule Take 1 capsule (100 mg total) by mouth every 8 (eight) hours. 03/09/18   Wurst, [place], PA-C  cetirizine (ZYRTEC) 10 MG tablet Take 1 tablet (10 mg total) by mouth daily. 03/09/18   Wurst, [place], PA-C  fluticasone (FLONASE) 50 MCG/ACT nasal spray Place 2 sprays into both nostrils daily. 11/12/20   White, Elita Boone, NP  guaiFENesin (MUCINEX) 600 MG 12 hr tablet Take 1 tablet (600 mg total) by mouth 2 (two) times daily. 11/12/20   Valinda Hoar, NP      Allergies    Patient has no known allergies.    Review of Systems   Review of Systems  All other systems reviewed and are negative.   Physical Exam Updated Vital Signs BP (!) 196/103   Pulse (!)  105   Temp 99 F (37.2 C) (Oral)   Resp 18   Ht 5\' 10"  (1.778 m)   SpO2 99%   BMI 31.71 kg/m  Physical Exam Vitals and nursing note reviewed. Exam conducted with a chaperone present.  Constitutional:      General: He is not in acute distress.    Appearance: He is well-developed.  HENT:     Head: Normocephalic and atraumatic.  Eyes:     Conjunctiva/sclera: Conjunctivae normal.  Cardiovascular:     Rate and Rhythm: Normal rate and regular rhythm.  Pulmonary:     Effort: Pulmonary effort is normal. No respiratory distress.     Breath sounds: No stridor.  Abdominal:     General: There is no distension.     Tenderness: There is no abdominal tenderness. There is no guarding.  Genitourinary:   Skin:    General: Skin is warm and dry.  Neurological:     Mental Status: He is alert and oriented to person, place, and time.     ED Results / Procedures / Treatments   Labs (all labs ordered are listed, but only abnormal results are displayed) Labs Reviewed - No data to display  EKG None  Radiology No results found.  Procedures . Incision and Drainage  Date/Time: 09/25/2021 11:27 AM  Performed by: 09/27/2021, MD Authorized by: Gerhard Munch, MD   Consent:  Consent obtained:  Verbal   Consent given by:  Patient   Risks, benefits, and alternatives were discussed: yes     Risks discussed:  Bleeding, infection and pain   Alternatives discussed:  Alternative treatment Universal protocol:    Procedure explained and questions answered to patient or proxy's satisfaction: yes     Relevant documents present and verified: yes     Test results available : yes     Required blood products, implants, devices, and special equipment available: yes     Site/side marked: yes     Immediately prior to procedure, a time out was called: yes     Patient identity confirmed:  Verbally with patient Location:    Type:  External thrombosed hemorrhoid   Size:  5   Location:   Anogenital   Anogenital location:  Perianal Pre-procedure details:    Skin preparation:  Antiseptic wash and povidone-iodine Sedation:    Sedation type:  None Anesthesia:    Anesthesia method:  Local infiltration   Local anesthetic:  Lidocaine 1% w/o epi Procedure type:    Complexity:  Simple Procedure details:    Ultrasound guidance: no     Needle aspiration: no     Incision types:  Elliptical   Incision depth:  Fascia   Wound management:  Probed and deloculated, irrigated with saline and extensive cleaning   Drainage:  Bloody   Drainage amount:  Moderate   Wound treatment:  Wound left open Post-procedure details:    Procedure completion:  Tolerated     Medications Ordered in ED Medications  hydrocortisone (ANUSOL-HC) suppository 25 mg (has no administration in time range)  ondansetron (ZOFRAN-ODT) disintegrating tablet 4 mg (4 mg Oral Given 09/25/21 0805)  lidocaine (PF) (XYLOCAINE) 1 % injection 30 mL (30 mLs Intradermal Given 09/25/21 1003)  lidocaine (LMX) 4 % cream (1 Application Topical Given 09/25/21 1012)  chlorthalidone (HYGROTON) tablet 25 mg (25 mg Oral Given 09/25/21 1012)    ED Course/ Medical Decision Making/ A&P                           Medical Decision Making Adult male, generally well-appearing presents after episode nausea, vomiting, is found to have generally reassuring physical exam with a soft, nonperitoneal abdomen, no distress, no active emesis here, but patient is found to notable external hemorrhoids, requiring procedure as above.  No evidence for bacteremia, sepsis, the patient is heart rate diminished, blood pressure elevated, but he notes he has not taken his medication as directed.  Patient started on Hygroton, received suppository steroids, topical dressing, will follow-up with her surgical clinic.  Risk OTC drugs. Prescription drug management.    Final Clinical Impression(s) / ED Diagnoses Final diagnoses:  Thrombosed hemorrhoids    Rx /  DC Orders ED Discharge Orders          Ordered    hydrocortisone (ANUSOL-HC) 25 MG suppository  2 times daily        09/25/21 1131    chlorthalidone (HYGROTON) 25 MG tablet  Daily        09/25/21 1131              Carmin Muskrat, MD 09/25/21 1628

## 2021-09-25 NOTE — ED Notes (Signed)
Pt is aware that a urine sample is needed, urinal at bedside. 

## 2021-09-25 NOTE — ED Provider Triage Note (Signed)
Emergency Medicine Provider Triage Evaluation Note  Andre Castro , a 51 y.o. male  was evaluated in triage.  Pt complains of bright red red blood per rectum, hemorrhoid pain, nausea, diarrhea.  He reports drinking 2 or 3 smoothies on Monday evening and then waking up with symptoms Tuesday morning.  Has a history of hemorrhoids that he states he normally pushes back into place when they are causing pain but he has had numerous episodes of diarrhea and has not been able to push his hemorrhoids back in place this time.  Review of Systems  Positive: As above Negative: Fevers, chills, hematemesis, abdominal pain  Physical Exam  BP (!) 175/115   Pulse (!) 109   Temp 99 F (37.2 C) (Oral)   Resp 18   Ht 5\' 10"  (1.778 m)   SpO2 99%   BMI 31.71 kg/m  Gen:   Awake, mild distress, patient is standing and unable to sit due to pain Resp:  Normal effort  MSK:   Moves extremities without difficulty  Other:  Defer rectal exam in triage  Medical Decision Making  Medically screening exam initiated at 8:02 AM.  Appropriate orders placed.  BRYEN HINDERMAN was informed that the remainder of the evaluation will be completed by another provider, this initial triage assessment does not replace that evaluation, and the importance of remaining in the ED until their evaluation is complete.  We will treat nausea with Zofran ODT, defer rectal exam in triage   Roylene Reason, Hershal Coria 09/25/21 0805

## 2021-09-25 NOTE — ED Triage Notes (Signed)
Patient reports that he has hemorrhoids and "they have fallen out and now having bleeding." Patient also c/o N/v/D 2 days.

## 2021-09-25 NOTE — Discharge Instructions (Addendum)
Please be sure to use Epsom salt baths, which hazel pads, and the prescribed steroids for relief.  Similarly, be sure to follow-up with our surgery colleagues in clinic for appropriate ongoing outpatient management.  Return here for concerning changes in your condition.

## 2021-11-19 ENCOUNTER — Ambulatory Visit: Payer: Self-pay | Admitting: Nurse Practitioner

## 2022-01-12 ENCOUNTER — Ambulatory Visit: Payer: Self-pay | Admitting: Nurse Practitioner

## 2022-01-31 ENCOUNTER — Observation Stay (HOSPITAL_COMMUNITY): Payer: BC Managed Care – PPO

## 2022-01-31 ENCOUNTER — Encounter (HOSPITAL_COMMUNITY): Payer: Self-pay

## 2022-01-31 ENCOUNTER — Observation Stay (HOSPITAL_COMMUNITY)
Admission: EM | Admit: 2022-01-31 | Discharge: 2022-02-02 | Disposition: A | Discharge status: Home / Self Care | Payer: BC Managed Care – PPO | Attending: Internal Medicine | Admitting: Internal Medicine

## 2022-01-31 ENCOUNTER — Emergency Department (HOSPITAL_COMMUNITY): Payer: BC Managed Care – PPO

## 2022-01-31 DIAGNOSIS — Z7982 Long term (current) use of aspirin: Secondary | ICD-10-CM | POA: Insufficient documentation

## 2022-01-31 DIAGNOSIS — Z79899 Other long term (current) drug therapy: Secondary | ICD-10-CM | POA: Diagnosis not present

## 2022-01-31 DIAGNOSIS — E871 Hypo-osmolality and hyponatremia: Secondary | ICD-10-CM | POA: Diagnosis not present

## 2022-01-31 DIAGNOSIS — E876 Hypokalemia: Secondary | ICD-10-CM | POA: Insufficient documentation

## 2022-01-31 DIAGNOSIS — G459 Transient cerebral ischemic attack, unspecified: Secondary | ICD-10-CM | POA: Diagnosis not present

## 2022-01-31 DIAGNOSIS — Z86718 Personal history of other venous thrombosis and embolism: Secondary | ICD-10-CM | POA: Insufficient documentation

## 2022-01-31 DIAGNOSIS — E119 Type 2 diabetes mellitus without complications: Secondary | ICD-10-CM | POA: Diagnosis not present

## 2022-01-31 DIAGNOSIS — R29818 Other symptoms and signs involving the nervous system: Secondary | ICD-10-CM | POA: Diagnosis present

## 2022-01-31 DIAGNOSIS — Z8673 Personal history of transient ischemic attack (TIA), and cerebral infarction without residual deficits: Secondary | ICD-10-CM | POA: Insufficient documentation

## 2022-01-31 DIAGNOSIS — Z794 Long term (current) use of insulin: Secondary | ICD-10-CM | POA: Diagnosis not present

## 2022-01-31 DIAGNOSIS — Z86711 Personal history of pulmonary embolism: Secondary | ICD-10-CM | POA: Insufficient documentation

## 2022-01-31 DIAGNOSIS — Z860100 Personal history of colon polyps, unspecified: Secondary | ICD-10-CM

## 2022-01-31 DIAGNOSIS — I10 Essential (primary) hypertension: Secondary | ICD-10-CM | POA: Diagnosis not present

## 2022-01-31 DIAGNOSIS — R944 Abnormal results of kidney function studies: Secondary | ICD-10-CM | POA: Insufficient documentation

## 2022-01-31 LAB — HEMOGLOBIN A1C
Hgb A1c MFr Bld: 6.4 % — ABNORMAL HIGH (ref 4.8–5.6)
Mean Plasma Glucose: 136.98 mg/dL

## 2022-01-31 LAB — CBC WITH DIFFERENTIAL/PLATELET
Abs Immature Granulocytes: 0 10*3/uL (ref 0.00–0.07)
Basophils Absolute: 0 10*3/uL (ref 0.0–0.1)
Basophils Relative: 1 %
Eosinophils Absolute: 0 10*3/uL (ref 0.0–0.5)
Eosinophils Relative: 1 %
HCT: 44.1 % (ref 39.0–52.0)
Hemoglobin: 15.2 g/dL (ref 13.0–17.0)
Immature Granulocytes: 0 %
Lymphocytes Relative: 57 %
Lymphs Abs: 2.6 10*3/uL (ref 0.7–4.0)
MCH: 30.8 pg (ref 26.0–34.0)
MCHC: 34.5 g/dL (ref 30.0–36.0)
MCV: 89.5 fL (ref 80.0–100.0)
Monocytes Absolute: 0.5 10*3/uL (ref 0.1–1.0)
Monocytes Relative: 10 %
Neutro Abs: 1.4 10*3/uL — ABNORMAL LOW (ref 1.7–7.7)
Neutrophils Relative %: 31 %
Platelets: 187 10*3/uL (ref 150–400)
RBC: 4.93 MIL/uL (ref 4.22–5.81)
RDW: 12.9 % (ref 11.5–15.5)
WBC: 4.5 10*3/uL (ref 4.0–10.5)
nRBC: 0 % (ref 0.0–0.2)

## 2022-01-31 LAB — COMPREHENSIVE METABOLIC PANEL
ALT: 27 U/L (ref 0–44)
AST: 42 U/L — ABNORMAL HIGH (ref 15–41)
Albumin: 4.5 g/dL (ref 3.5–5.0)
Alkaline Phosphatase: 64 U/L (ref 38–126)
Anion gap: 7 (ref 5–15)
BUN: 17 mg/dL (ref 6–20)
CO2: 28 mmol/L (ref 22–32)
Calcium: 9 mg/dL (ref 8.9–10.3)
Chloride: 99 mmol/L (ref 98–111)
Creatinine, Ser: 1.26 mg/dL — ABNORMAL HIGH (ref 0.61–1.24)
GFR, Estimated: >60 mL/min (ref >60)
Glucose, Bld: 161 mg/dL — ABNORMAL HIGH (ref 70–99)
Potassium: 3.9 mmol/L (ref 3.5–5.1)
Sodium: 134 mmol/L — ABNORMAL LOW (ref 135–145)
Total Bilirubin: 1.3 mg/dL — ABNORMAL HIGH (ref 0.3–1.2)
Total Protein: 8.2 g/dL — ABNORMAL HIGH (ref 6.5–8.1)

## 2022-01-31 LAB — CBG MONITORING, ED: Glucose-Capillary: 156 mg/dL — ABNORMAL HIGH (ref 70–99)

## 2022-01-31 MED ORDER — ACETAMINOPHEN 160 MG/5ML PO SOLN: 650.0000 mg | ORAL | Status: DC | PRN

## 2022-01-31 MED ORDER — STROKE: EARLY STAGES OF RECOVERY BOOK
Freq: Once | Status: AC
  Filled 2022-01-31 (×2): qty 1

## 2022-01-31 MED ORDER — ACETAMINOPHEN 325 MG PO TABS: 650.0000 mg | ORAL_TABLET | ORAL | Status: DC | PRN

## 2022-01-31 MED ORDER — ASPIRIN 325 MG PO TABS
325.0000 mg | ORAL_TABLET | Freq: Every day | ORAL | Status: DC
  Administered 2022-01-31: 325 mg via ORAL
  Filled 2022-01-31: qty 1

## 2022-01-31 MED ORDER — SODIUM CHLORIDE 0.9 % IV SOLN: INTRAVENOUS | Status: DC

## 2022-01-31 MED ORDER — ASPIRIN 300 MG RE SUPP: 300.0000 mg | Freq: Every day | RECTAL | Status: DC

## 2022-01-31 MED ORDER — SENNOSIDES-DOCUSATE SODIUM 8.6-50 MG PO TABS: 1.0000 | ORAL_TABLET | Freq: Every evening | ORAL | Status: DC | PRN

## 2022-01-31 MED ORDER — INSULIN ASPART 100 UNIT/ML IJ SOLN
0.0000 [IU] | Freq: Three times a day (TID) | INTRAMUSCULAR | Status: DC
  Administered 2022-02-01: 1 [IU] via SUBCUTANEOUS
  Administered 2022-02-01: 2 [IU] via SUBCUTANEOUS
  Administered 2022-02-01 – 2022-02-02 (×2): 1 [IU] via SUBCUTANEOUS
  Filled 2022-01-31: qty 0.09

## 2022-01-31 MED ORDER — ACETAMINOPHEN 650 MG RE SUPP: 650.0000 mg | RECTAL | Status: DC | PRN

## 2022-01-31 MED ORDER — ENOXAPARIN SODIUM 40 MG/0.4ML IJ SOSY
40.0000 mg | PREFILLED_SYRINGE | Freq: Every day | INTRAMUSCULAR | Status: DC
  Administered 2022-02-01 (×2): 40 mg via SUBCUTANEOUS
  Filled 2022-01-31 (×2): qty 0.4

## 2022-01-31 NOTE — H&P (Signed)
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 In the ED hypertensive 160/110, afebrile.  Labs mild hyponatremia mild elevation with creatinine, TB 1.3 normal CBC glucose 161, was nonfocal on examination, CT scan showed a lacunar infarct in the left basilar ganglia age-indeterminate, neurology was consulted w/ Dr Cheral Marker and advised admission for TIA workup. Last office visit was in March 09, 2016 2022 supposed to be on HCTZ 12.5 mg. He has been discharged by neurology due to no-show has been taking medication intermittently and mostly noncompliant He was seen In ED 09/25/2021 w/ thrombosed hemorrhoids> he was given refills on his BP meds. Currently he feels well and has no complaints Patient otherwise denies any nausea, vomiting, chest pain, shortness of breath, fever, chills, headache, focal weakness, numbness tingling, speech difficulties.   Assessment/Plan Principal Problem:   TIA (transient ischemic attack) Active Problems:   Diabetes mellitus type 2, uncomplicated ([place])   History of colonic polyps   TIA: Had brief episode of slurred speech/dizziness/left arm numbness resolved.  Will admit the patient for stroke workup neurology has been consulted and will await their inputs regarding anticoagulation and  wil ldo stroke work up as below Admit to tele BP goal : Permissive HTN upto 220/110 mmHg  MRI Brain:  Echocardiogram w/ bubble Prophylactic therapy-ASA High intensity Statin if LDL > 70 Check HgbA1c, fasting lipid panel PT consult, OT consult, Speech consult Telemetry monitoring Frequent neuro checks Stroke swallow screen.  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  Awaiting further MRI workup to decide treatment plan Hypertension BP on higher side allow permissive hypertension pending MRI.  Of note not compliant with medication recently Type 2 diabetes mellitus add sliding scale insulin, check HbA1c Hyperlipidemia checking fasting lipid profile.  Not on a statin due to noncompliance  Mild hyponatremia: Gentle IV fluids Mildly elevated creatinine slightly above baseline  There is no height or weight on file to calculate BMI.   Severity of Illness: The appropriate patient status for this patient is OBSERVATION. Observation status is judged to be reasonable and necessary in order to provide the required intensity of service to ensure the patient's safety. The patient's presenting symptoms, physical exam findings, and initial radiographic and laboratory data in the context of their medical condition is felt to place them at decreased risk for further clinical deterioration. Furthermore, it is anticipated that the patient will be medically stable for discharge from the hospital within 2 midnights of admission.    DVT prophylaxis: enoxaparin (LOVENOX) injection 40 mg Start: 01/31/22 2200 Code Status:   Code Status: Full Code  Family Communication: Admission, patients condition and plan of care including tests being  ordered have been discussed with the patient who indicate understanding and agree with the plan and Code  Status.  Consults called:Neurology  Review of Systems: All systems were reviewed and were negative except as mentioned in HPI above. Negative for fever Negative for chest pain Negative for shortness of breath  Past Medical History:  Diagnosis Date   Allergy    Diabetes mellitus without complication (HCC)    Hemorrhoids     Past Surgical History:  Procedure Laterality Date   WISDOM TOOTH EXTRACTION       reports that he has never smoked. He has never used smokeless tobacco. He reports that he does not drink alcohol and does not use drugs.  No Known Allergies  Family History  Problem Relation Age of Onset   Hypertension Mother    Stroke Maternal Grandmother    Cancer Maternal Grandfather    Cancer Paternal Grandmother    Stroke Paternal Grandmother      Prior to Admission medications   Medication Sig Start Date End Date Taking? Authorizing Provider  benzonatate (TESSALON) 100 MG capsule Take 1 capsule (100 mg total) by mouth every 8 (eight) hours. 03/09/18   Wurst, [place], PA-C  cetirizine (ZYRTEC) 10 MG tablet Take 1 tablet (10 mg total) by mouth daily. 03/09/18   Wurst, [place], PA-C  chlorthalidone (HYGROTON) 25 MG tablet Take 1 tablet (25 mg total) by mouth daily. 09/25/21   Gerhard Munch, MD  fluticasone [facility]) 50 MCG/ACT nasal spray Place 2 sprays into both nostrils daily. 11/12/20   White, Elita Boone, NP  guaiFENesin (MUCINEX) 600 MG 12 hr tablet Take 1 tablet (600 mg total) by mouth 2 (two) times daily. 11/12/20   White, Elita Boone, NP  hydrocortisone (ANUSOL-HC) 25 MG suppository Place 1 suppository (25 mg total) rectally 2 (two) times daily. 09/25/21   Gerhard Munch, MD    Physical Exam: Vitals:   01/31/22 1426  BP: (!) 160/110  Pulse: 87  Resp: 11  Temp: 98 F (36.7 C)  TempSrc: Oral  SpO2: 99%   General exam: AAOx3 , NAD, weak appearing. HEENT:Oral mucosa moist, Ear/Nose WNL grossly, dentition normal. Respiratory system: bilaterally clear,no  wheezing or crackles,no use of accessory muscle Cardiovascular system: S1 & S2 +, No JVD,. Gastrointestinal system: Abdomen soft, NT,ND, BS+ Nervous System:Alert, awake, moving extremities and grossly nonfocal Extremities: No edema, distal peripheral pulses palpable.  Skin: No rashes,no icterus. MSK: Normal muscle bulk,tone, power   Labs on Admission: I have personally reviewed following labs and imaging studies  CBC: Recent Labs  Lab 01/31/22 1436  WBC 4.5  NEUTROABS 1.4*  HGB 15.2  HCT 44.1  MCV 89.5  PLT 187   Basic Metabolic Panel: Recent Labs  Lab 01/31/22 1436  NA 134*  K 3.9  CL 99  CO2 28  GLUCOSE 161*  BUN 17  CREATININE 1.26*  CALCIUM 9.0   GFR: CrCl cannot be calculated (Unknown ideal weight.). Liver Function Tests: Recent Labs  Lab 01/31/22 1436  AST 42*  ALT 27  ALKPHOS 64  BILITOT 1.3*  PROT 8.2*  ALBUMIN 4.5   Recent Labs  Lab 01/31/22 1425  GLUCAP 156*  Urine analysis:    Component Value Date/Time   BILIRUBINUR neg 05/25/2014 1415   PROTEINUR neg 05/25/2014 1415   UROBILINOGEN 0.2 05/25/2014 1415   NITRITE neg 05/25/2014 1415   LEUKOCYTESUR Negative 05/25/2014 1415  Radiological Exams on Admission: CT Head Wo Contrast  Result Date: 01/31/2022 CLINICAL DATA:  TIA.  Dizziness.  Aphasia. EXAM: CT HEAD WITHOUT CONTRAST TECHNIQUE: Contiguous axial images were obtained from the base of the skull through the vertex without intravenous contrast. RADIATION DOSE REDUCTION: This exam was performed according to the departmental dose-optimization program which includes automated exposure control, adjustment of the mA and/or kV according to patient size and/or use of iterative reconstruction technique. COMPARISON:  None Available. FINDINGS: Brain: No subdural, epidural, or subarachnoid hemorrhage. A lacunar infarct is identified in the left basal ganglia on series 2, image 16. No other evidence of ischemia or infarct. No mass effect or midline shift.  Ventricles and sulci are unremarkable. Cerebellum, brainstem, and basal cisterns are normal. Vascular: No hyperdense vessel or unexpected calcification. Skull: Normal. Negative for fracture or focal lesion. Sinuses/Orbits: No acute finding. Other: None. IMPRESSION: 1. There is a lacunar infarct in the left basal ganglia which is age indeterminate. No other evidence of ischemia or infarct. 2. No other abnormalities. Electronically Signed   By: Dorise Bullion III M.D.   On: 01/31/2022 15:46     Antonieta Pert MD [facility]  If 7PM-7AM, please contact night-coverage [email or web address]  01/31/2022, 5:46 PM

## 2022-01-31 NOTE — ED Provider Notes (Signed)
[place] [facility]ERGENCY DEPARTMENT AT [facility] Provider Note   CSN: [id number] Arrival date & time: 01/31/22  1419     History  Chief Complaint  Patient presents with   Dizziness   Aphasia    Andre Castro is a 52 y.o. male.  Patient here with strokelike symptoms.  He had brief episode of slurred speech, then dizziness, down left arm numbness that has now resolved.  Has a history of stroke, blood clots but does not take any anticoagulation or aspirin or Plavix.  This occurred about 4 years ago he says in the [place].  He supposed to maybe be taking blood pressure medicine and maybe some diabetes medications but ultimately does not seem like he is taking medications he supposed to.  He denies any chest pain or shortness of breath.  Denies any smoking history.  Denies any current symptoms.  He states he generally has not felt well the last few weeks.  The history is provided by the patient.       Home Medications Prior to Admission medications   Medication Sig Start Date End Date Taking? Authorizing Provider  benzonatate (TESSALON) 100 MG capsule Take 1 capsule (100 mg total) by mouth every 8 (eight) hours. 03/09/18   Wurst, [place], PA-C  cetirizine (ZYRTEC) 10 MG tablet Take 1 tablet (10 mg total) by mouth daily. 03/09/18   Wurst, [place], PA-C  chlorthalidone (HYGROTON) 25 MG tablet Take 1 tablet (25 mg total) by mouth daily. 09/25/21   Gerhard Munch, MD  fluticasone [facility]) 50 MCG/ACT nasal spray Place 2 sprays into both nostrils daily. 11/12/20   White, Elita Boone, NP  guaiFENesin (MUCINEX) 600 MG 12 hr tablet Take 1 tablet (600 mg total) by mouth 2 (two) times daily. 11/12/20   White, Elita Boone, NP  hydrocortisone (ANUSOL-HC) 25 MG suppository Place 1 suppository (25 mg total) rectally 2 (two) times daily. 09/25/21   Gerhard Munch, MD      Allergies    Patient has no known allergies.    Review of Systems   Review of Systems  Physical Exam Updated  Vital Signs BP (!) 160/110 (BP Location: Left Arm)   Pulse 87   Temp 98 F (36.7 C) (Oral)   Resp 11   SpO2 99%  Physical Exam Vitals and nursing note reviewed.  Constitutional:      General: He is not in acute distress.    Appearance: He is well-developed.  HENT:     Head: Normocephalic and atraumatic.  Eyes:     Extraocular Movements: Extraocular movements intact.     Conjunctiva/sclera: Conjunctivae normal.     Pupils: Pupils are equal, round, and reactive to light.  Cardiovascular:     Rate and Rhythm: Normal rate and regular rhythm.     Heart sounds: No murmur heard. Pulmonary:     Effort: Pulmonary effort is normal. No respiratory distress.     Breath sounds: Normal breath sounds.  Abdominal:     Palpations: Abdomen is soft.     Tenderness: There is no abdominal tenderness.  Musculoskeletal:        General: No swelling.     Cervical back: Neck supple.  Skin:    General: Skin is warm and dry.     Capillary Refill: Capillary refill takes less than 2 seconds.  Neurological:     General: No focal deficit present.     Mental Status: He is alert and oriented to person, place, and time.  Cranial Nerves: No cranial nerve deficit.     Sensory: No sensory deficit.     Motor: No weakness.     Coordination: Coordination normal.     Gait: Gait normal.     Comments: 5+ out of 5 strength throughout, normal sensation, no drift, normal finger-nose-finger, normal speech  Psychiatric:        Mood and Affect: Mood normal.     ED Results / Procedures / Treatments   Labs (all labs ordered are listed, but only abnormal results are displayed) Labs Reviewed  CBG MONITORING, ED - Abnormal; Notable for the following components:      Result Value   Glucose-Capillary 156 (*)    All other components within normal limits  COMPREHENSIVE METABOLIC PANEL  CBC WITH DIFFERENTIAL/PLATELET  HEMOGLOBIN A1C    EKG None  Radiology CT Head Wo Contrast  Result Date:  01/31/2022 CLINICAL DATA:  TIA.  Dizziness.  Aphasia. EXAM: CT HEAD WITHOUT CONTRAST TECHNIQUE: Contiguous axial images were obtained from the base of the skull through the vertex without intravenous contrast. RADIATION DOSE REDUCTION: This exam was performed according to the departmental dose-optimization program which includes automated exposure control, adjustment of the mA and/or kV according to patient size and/or use of iterative reconstruction technique. COMPARISON:  None Available. FINDINGS: Brain: No subdural, epidural, or subarachnoid hemorrhage. A lacunar infarct is identified in the left basal ganglia on series 2, image 16. No other evidence of ischemia or infarct. No mass effect or midline shift. Ventricles and sulci are unremarkable. Cerebellum, brainstem, and basal cisterns are normal. Vascular: No hyperdense vessel or unexpected calcification. Skull: Normal. Negative for fracture or focal lesion. Sinuses/Orbits: No acute finding. Other: None. IMPRESSION: 1. There is a lacunar infarct in the left basal ganglia which is age indeterminate. No other evidence of ischemia or infarct. 2. No other abnormalities. Electronically Signed   By: Dorise Bullion III M.D.   On: 01/31/2022 15:46    Procedures Procedures    Medications Ordered in ED Medications - No data to display  ED Course/ Medical Decision Making/ A&P                             Medical Decision Making Amount and/or Complexity of Data Reviewed Labs: ordered.   Andre Castro is here with strokelike symptoms.  Hypertension but otherwise unremarkable vitals.  He had a brief episode of slurred speech followed by dizziness followed by left arm numbness that has now resolved.  This started about an hour ago each episode lasted about a minute.  He has comorbidities of history of stroke, blood clot, hypertension, diabetes.  He does not really take any medication.  Differential is that this is likely TIA, seems less likely blood  pressure related or mass related.  Will check basic labs including CBC and BMP and head CT and will discuss with neurology.  Reviewing chart from hospital stay in 2020 he did have a small vessel stroke, did have a blood clot in his lung.  But he is no longer on Eliquis, not taking blood pressure medicine, does not appear to be taking medicine for diabetes.  CT scan shows a lacunar infarct in the left basal ganglia which is age-indeterminate.  I suspect that this is his old prior stroke.  Lab work per my review interpretation unremarkable.  No significant anemia or electrolyte abnormality.  I have talked with Dr. Cheral Marker with neurology.  Will admit for  further TIA workup.  This chart was dictated using voice recognition software.  Despite best efforts to proofread,  errors can occur which can change the documentation meaning.         Final Clinical Impression(s) / ED Diagnoses Final diagnoses:  TIA (transient ischemic attack)    Rx / DC Orders ED Discharge Orders     None         Lennice Sites, DO 01/31/22 1637

## 2022-01-31 NOTE — ED Provider Triage Note (Signed)
Emergency Medicine Provider Triage Evaluation Note  Andre Castro , a 52 y.o. male  was evaluated in triage.  Pt complains of dizziness and aphasia.  Patient reports that he had a brief episode where he felt that his speech was slightly slurred and was experiencing some dizziness but patient has since resolved.  Patient reports he has a history of a previous stroke he had been taking blood pressure and blood thinners for this but has not been on his medications for some time now.  Patient denies any lingering weakness, numbness, slurred speech.  Patient is prediabetic.  Review of Systems  Positive: As above Negative: As above  Physical Exam  BP (!) 160/110 (BP Location: Left Arm)   Pulse 87   Temp 98 F (36.7 C) (Oral)   Resp 11   SpO2 99%  Gen:   Awake, no distress  Resp:  Normal effort  MSK:   Moves extremities without difficulty  Other:  Neurological exam normal  Medical Decision Making  Medically screening exam initiated at 2:33 PM.  Appropriate orders placed.  NIKKI RUSNAK was informed that the remainder of the evaluation will be completed by another provider, this initial triage assessment does not replace that evaluation, and the importance of remaining in the ED until their evaluation is complete.     Luvenia Heller, PA-C 01/31/22 1434

## 2022-01-31 NOTE — ED Triage Notes (Signed)
Pt arrived via POV, c/o slurred speech and dizziness at 2pm today, in triage no sx present, states resolved right before arrival. Has not been compliant with BP meds recently. Grip strengths equal. No slurred speech noted in triage.

## 2022-01-31 NOTE — Hospital Course (Addendum)
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 In the ED hypertensive 160/110, afebrile.  Labs mild hyponatremia mild elevation with creatinine, TB 1.3 normal CBC glucose 161, was nonfocal on examination, CT scan showed a lacunar infarct in the left basilar ganglia age-indeterminate, neurology was consulted w/ Dr Cheral Marker and advised admission for TIA workup. Last office visit was in March 09, 2016 2022 supposed to be on HCTZ 12.5 mg. He has been discharged by neurology due to no-show has been taking medication intermittently and mostly noncompliant He was seen In ED 09/25/2021 w/ thrombosed hemorrhoids> he was given refills on his BP meds.

## 2022-02-01 ENCOUNTER — Encounter (HOSPITAL_COMMUNITY): Payer: Self-pay | Admitting: Internal Medicine

## 2022-02-01 ENCOUNTER — Observation Stay (HOSPITAL_BASED_OUTPATIENT_CLINIC_OR_DEPARTMENT_OTHER): Payer: BC Managed Care – PPO

## 2022-02-01 ENCOUNTER — Other Ambulatory Visit: Payer: Self-pay

## 2022-02-01 ENCOUNTER — Encounter (HOSPITAL_COMMUNITY): Payer: PRIVATE HEALTH INSURANCE

## 2022-02-01 ENCOUNTER — Observation Stay (HOSPITAL_COMMUNITY): Payer: BC Managed Care – PPO

## 2022-02-01 DIAGNOSIS — G459 Transient cerebral ischemic attack, unspecified: Secondary | ICD-10-CM | POA: Diagnosis not present

## 2022-02-01 LAB — COMPREHENSIVE METABOLIC PANEL
ALT: 22 U/L (ref 0–44)
AST: 29 U/L (ref 15–41)
Albumin: 3.7 g/dL (ref 3.5–5.0)
Alkaline Phosphatase: 56 U/L (ref 38–126)
Anion gap: 9 (ref 5–15)
BUN: 15 mg/dL (ref 6–20)
CO2: 28 mmol/L (ref 22–32)
Calcium: 9.3 mg/dL (ref 8.9–10.3)
Chloride: 98 mmol/L (ref 98–111)
Creatinine, Ser: 1.3 mg/dL — ABNORMAL HIGH (ref 0.61–1.24)
GFR, Estimated: >60 mL/min (ref >60)
Glucose, Bld: 143 mg/dL — ABNORMAL HIGH (ref 70–99)
Potassium: 3 mmol/L — ABNORMAL LOW (ref 3.5–5.1)
Sodium: 135 mmol/L (ref 135–145)
Total Bilirubin: 1.1 mg/dL (ref 0.3–1.2)
Total Protein: 6.8 g/dL (ref 6.5–8.1)

## 2022-02-01 LAB — CBG MONITORING, ED
Glucose-Capillary: 113 mg/dL — ABNORMAL HIGH (ref 70–99)
Glucose-Capillary: 122 mg/dL — ABNORMAL HIGH (ref 70–99)
Glucose-Capillary: 141 mg/dL — ABNORMAL HIGH (ref 70–99)

## 2022-02-01 LAB — ECHOCARDIOGRAM COMPLETE BUBBLE STUDY
Area-P 1/2: 2.46 cm2
S' Lateral: 2.5 cm

## 2022-02-01 LAB — BASIC METABOLIC PANEL
Anion gap: 8 (ref 5–15)
BUN: 15 mg/dL (ref 6–20)
CO2: 28 mmol/L (ref 22–32)
Calcium: 8.2 mg/dL — ABNORMAL LOW (ref 8.9–10.3)
Chloride: 100 mmol/L (ref 98–111)
Creatinine, Ser: 1.14 mg/dL (ref 0.61–1.24)
GFR, Estimated: >60 mL/min (ref >60)
Glucose, Bld: 157 mg/dL — ABNORMAL HIGH (ref 70–99)
Potassium: 2.7 mmol/L — CL (ref 3.5–5.1)
Sodium: 136 mmol/L (ref 135–145)

## 2022-02-01 LAB — MAGNESIUM: Magnesium: 2.2 mg/dL (ref 1.7–2.4)

## 2022-02-01 LAB — RAPID URINE DRUG SCREEN, HOSP PERFORMED
Amphetamines: NOT DETECTED
Barbiturates: NOT DETECTED
Benzodiazepines: NOT DETECTED
Cocaine: NOT DETECTED
Opiates: NOT DETECTED
Tetrahydrocannabinol: NOT DETECTED

## 2022-02-01 LAB — LIPID PANEL
Cholesterol: 191 mg/dL (ref 0–200)
HDL: 36 mg/dL — ABNORMAL LOW (ref >40)
LDL Cholesterol: 142 mg/dL — ABNORMAL HIGH (ref 0–99)
Total CHOL/HDL Ratio: 5.3 RATIO
Triglycerides: 66 mg/dL (ref <150)
VLDL: 13 mg/dL (ref 0–40)

## 2022-02-01 LAB — GLUCOSE, CAPILLARY
Glucose-Capillary: 151 mg/dL — ABNORMAL HIGH (ref 70–99)
Glucose-Capillary: 109 mg/dL — ABNORMAL HIGH (ref 70–99)

## 2022-02-01 MED ORDER — ASPIRIN 81 MG PO TBEC
81.0000 mg | DELAYED_RELEASE_TABLET | Freq: Every day | ORAL | Status: DC
  Administered 2022-02-01 – 2022-02-02 (×2): 81 mg via ORAL
  Filled 2022-02-01 (×2): qty 1

## 2022-02-01 MED ORDER — IOHEXOL 350 MG/ML SOLN
75.0000 mL | Freq: Once | INTRAVENOUS | Status: AC | PRN
  Administered 2022-02-01: 75 mL via INTRAVENOUS

## 2022-02-01 MED ORDER — CLOPIDOGREL BISULFATE 75 MG PO TABS
75.0000 mg | ORAL_TABLET | Freq: Every day | ORAL | Status: DC
  Administered 2022-02-01 – 2022-02-02 (×2): 75 mg via ORAL
  Filled 2022-02-01 (×2): qty 1

## 2022-02-01 MED ORDER — ATORVASTATIN CALCIUM 40 MG PO TABS
80.0000 mg | ORAL_TABLET | Freq: Every day | ORAL | Status: DC
  Administered 2022-02-01 – 2022-02-02 (×2): 80 mg via ORAL
  Filled 2022-02-01 (×2): qty 2

## 2022-02-01 MED ORDER — POTASSIUM CHLORIDE CRYS ER 20 MEQ PO TBCR: 30.0000 meq | EXTENDED_RELEASE_TABLET | Freq: Two times a day (BID) | ORAL | Status: DC

## 2022-02-01 MED ORDER — POTASSIUM CHLORIDE CRYS ER 20 MEQ PO TBCR
40.0000 meq | EXTENDED_RELEASE_TABLET | ORAL | Status: AC
  Administered 2022-02-01 (×2): 40 meq via ORAL
  Filled 2022-02-01 (×2): qty 2

## 2022-02-01 NOTE — Progress Notes (Signed)
Echocardiogram not complete due to other testing.  Will re-attempt as the schedule permits.   Jameison Haji RDCS  

## 2022-02-01 NOTE — Progress Notes (Signed)
  Echocardiogram 2D Echocardiogram has been performed.  Darlina Sicilian M 02/01/2022, 2:39 PM

## 2022-02-01 NOTE — Consult Note (Addendum)
Neurology Consultation  Reason for Consult: Left-sided weakness Referring Physician: Dr. Ronnald Nian  CC: Left-sided weakness  History is obtained from: Patient, chart  HPI: Andre Castro is a 52 y.o. 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  Symptom had resolved by the time of arrival.  No code stroke was activated. He was admitted for TIA workup given his prior history of stroke and focal deficits that were transiently present.  He drove himself to the emergency room. He also has a history of hypertension-noncompliant to medications   LKW: 2 PM on 01/31/2022 with brief symptoms followed by complete resolution IV thrombolysis given?: no, no symptoms at the time of arrival Premorbid modified Rankin scale (mRS): 0   ROS: Full ROS was performed and is negative except as noted in the HPI.  Past Medical History:  Diagnosis Date   Allergy    Diabetes mellitus without complication ([place])    Hemorrhoids      Family History  Problem Relation Age of Onset   Hypertension Mother    Stroke Maternal Grandmother    Cancer Maternal Grandfather    Cancer Paternal Grandmother    Stroke Paternal Grandmother      Social History:   reports that he has never smoked. He has never used smokeless tobacco. He reports that he does not drink alcohol and does not use drugs.  Medications  Current Facility-Administered Medications:     stroke: early stages of recovery book, , Does not apply, Once, Kc, Ramesh, MD   0.9 %  sodium chloride infusion, , Intravenous, Continuous, Kc, Ramesh, MD, Last Rate: 50 mL/hr at 02/01/22 0005, New Bag at 02/01/22  0005   acetaminophen (TYLENOL) tablet 650 mg, 650 mg, Oral, Q4H PRN **OR** acetaminophen (TYLENOL) 160 MG/5ML solution 650 mg, 650 mg, Per Tube, Q4H PRN **OR** acetaminophen (TYLENOL) suppository 650 mg, 650 mg, Rectal, Q4H PRN, Kc, Ramesh, MD   aspirin suppository 300 mg, 300 mg, Rectal, Daily **OR** aspirin tablet 325 mg, 325 mg, Oral, Daily, Kc, Ramesh, MD, 325 mg at 01/31/22 2004   enoxaparin (LOVENOX) injection 40 mg, 40 mg, Subcutaneous, QHS, Kc, Ramesh, MD, 40 mg at 02/01/22 0005   insulin aspart (novoLOG) injection 0-9 Units, 0-9 Units, Subcutaneous, TID WC, Kc, Ramesh, MD   senna-docusate (Senokot-S) tablet 1 tablet, 1 tablet, Oral, QHS PRN, Kc, Ramesh, MD  Current Outpatient Medications:    aspirin EC 81 MG tablet, Take 162 mg by mouth daily as needed for moderate pain (headache). Swallow whole., Disp: , Rfl:    chlorthalidone (HYGROTON) 25 MG tablet, Take 1 tablet (25 mg total) by mouth daily., Disp: 30 tablet, Rfl: 2   Misc Natural Products (FOCUSED MIND PO), Take 1 capsule by mouth in the morning and at bedtime. Alpha Brain, Disp: , Rfl:    benzonatate (TESSALON) 100 MG capsule, Take 1 capsule (100 mg total) by mouth every 8 (eight) hours. (Patient not taking: Reported on 01/31/2022), Disp: 21 capsule, Rfl: 0   cetirizine (ZYRTEC) 10 MG tablet, Take 1 tablet (10 mg total) by mouth daily. (Patient not taking: Reported on 01/31/2022), Disp: 30 tablet, Rfl: 0   fluticasone (FLONASE) 50 MCG/ACT  nasal spray, Place 2 sprays into both nostrils daily. (Patient not taking: Reported on 01/31/2022), Disp: 11.1 mL, Rfl: 0   guaiFENesin (MUCINEX) 600 MG 12 hr tablet, Take 1 tablet (600 mg total) by mouth 2 (two) times daily. (Patient not taking: Reported on 01/31/2022), Disp: 30 tablet, Rfl: 0   hydrocortisone (ANUSOL-HC) 25 MG suppository, Place 1 suppository (25 mg total) rectally 2 (two) times daily. (Patient not taking: Reported on 01/31/2022), Disp: 12 suppository, Rfl: 0   Exam: Current vital  signs: BP 129/82   Pulse 62   Temp 97.7 F (36.5 C) (Oral)   Resp 16   SpO2 100%  Vital signs in last 24 hours: Temp:  [97.7 F (36.5 C)-98.4 F (36.9 C)] 97.7 F (36.5 C) (01/27 2208) Pulse Rate:  [62-87] 62 (01/27 2208) Resp:  [11-16] 16 (01/27 2208) BP: (129-160)/(82-110) 129/82 (01/27 2208) SpO2:  [99 %-100 %] 100 % (01/27 2208)  GENERAL: Awake, alert in NAD HEENT: - Normocephalic and atraumatic, dry mm, no LN++, no Thyromegally LUNGS - Clear to auscultation bilaterally with no wheezes CV - S1S2 RRR, no m/r/g, equal pulses bilaterally. ABDOMEN - Soft, nontender, nondistended with normoactive BS Ext: warm, well perfused, intact peripheral pulses, no edema  NEURO:  Mental Status: AA&Ox3  Language: speech is intact with no dysarthria.  Naming, repetition, fluency, and comprehension intact.Cranial Nerves: PERRL EOMI, visual fields full, no facial asymmetry, facial sensation intact, hearing intact, tongue/uvula/soft palate midline, normal sternocleidomastoid and trapezius muscle strength. No evidence of tongue atrophy or fibrillations Motor: 5/5 in all fours without drift Tone: is normal and bulk is normal Sensation- Intact to light touch bilaterally Coordination: FTN intact bilaterally, no ataxia in BLE. Gait- deferred  NIHSS  0 Labs I have reviewed labs in epic and the results pertinent to this consultation are: CBC    Component Value Date/Time   WBC 4.5 01/31/2022 1436   RBC 4.93 01/31/2022 1436   HGB 15.2 01/31/2022 1436   HCT 44.1 01/31/2022 1436   PLT 187 01/31/2022 1436   MCV 89.5 01/31/2022 1436   MCV 89.8 03/30/2015 1647   MCH 30.8 01/31/2022 1436   MCHC 34.5 01/31/2022 1436   RDW 12.9 01/31/2022 1436   LYMPHSABS 2.6 01/31/2022 1436   MONOABS 0.5 01/31/2022 1436   EOSABS 0.0 01/31/2022 1436   BASOSABS 0.0 01/31/2022 1436    CMP     Component Value Date/Time   NA 134 (L) 01/31/2022 1436   K 3.9 01/31/2022 1436   CL 99 01/31/2022 1436   CO2 28  01/31/2022 1436   GLUCOSE 161 (H) 01/31/2022 1436   BUN 17 01/31/2022 1436   CREATININE 1.26 (H) 01/31/2022 1436   CREATININE 1.11 03/30/2015 1631   CALCIUM 9.0 01/31/2022 1436   PROT 8.2 (H) 01/31/2022 1436   ALBUMIN 4.5 01/31/2022 1436   AST 42 (H) 01/31/2022 1436   ALT 27 01/31/2022 1436   ALKPHOS 64 01/31/2022 1436   BILITOT 1.3 (H) 01/31/2022 1436   GFRNONAA >60 01/31/2022 1436   GFRNONAA 80 03/30/2015 1631   GFRAA >89 03/30/2015 1631    Lipid Panel     Component Value Date/Time   CHOL 257 (H) 03/30/2015 1631   TRIG 124 03/30/2015 1631   HDL 40 03/30/2015 1631   CHOLHDL 6.4 (H) 03/30/2015 1631   VLDL 25 03/30/2015 1631   LDLCALC 192 (H) 03/30/2015 1631     Imaging I have reviewed the images obtained:  CT-head-age-indeterminate left basal ganglia infarction, when compared to the  report from [place] in 2020, this might have been the stroke that he had in 2020.  MR brain-negative for stroke.  Few small lacunar infarctions in the basal ganglia bilaterally.  Assessment: 52 year old man with above past medical history presenting for transient episode of slurred speech and left-sided weakness that lasted a few minutes and resolved. Given his prior history of strokes at a young age, admitted for further stroke/TIA workup. At this time he is noncompliant to medications including antihypertensives as well as his Eliquis which was given to him for PE in the setting of COVID. Unclear etiology of his transient focal neurological deficit at this time.  Further workup is recommended  Impression Likely right hemispheric TIA  Recommendations: Admit to hospitalist Frequent neurochecks Telemetry Aspirin 81+ Plavix 75 for now High intensity statin for goal LDL less than 70 CT angio head and neck 2D echo A1c Lipid panel Lower extremity Dopplers PT OT Speech therapy I had thought of ordering hypercoagulable panel but he is already received subcu heparin and I would defer this  for outpatient follow-up. Permissive hypertension up to [id number] only if systolic greater than 992 on a as needed basis for the next day or 2 and then start normalizing blood pressures. Also check UDS  Stroke team to follow  If a bed does not become available prior to noon on Sunday, I would recommend keeping the patient at [facility], completing the workup and then reaching out to the stroke team so that they can chart check and provide final recommendations.   -- Amie Portland, MD Neurologist [facility] Pager: [phone]

## 2022-02-01 NOTE — Progress Notes (Signed)
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 In the ED hypertensive 160/110, afebrile.  Labs mild hyponatremia mild elevation with creatinine, TB 1.3 normal CBC glucose 161, was nonfocal on examination, CT scan showed a lacunar infarct in the left basilar ganglia age-indeterminate, neurology was consulted w/ Dr Otelia Limes and advised admission for TIA workup. Last office visit was in March 09, 2016 2022 supposed to be on HCTZ 12.5 mg. He has been discharged by neurology due to no-show has been taking medication intermittently and mostly noncompliant He was seen In ED 09/25/2021 w/ thrombosed hemorrhoids> he was given refills on his BP meds only. Patient admitted for TIA workup     Subjective: Seen and this morning resting comfortably overall feeling better   Assessment and Plan: Principal Problem:   TIA (transient ischemic attack) Active Problems:   Diabetes mellitus type 2, uncomplicated (HCC)   History of colonic polyps   TIA: MRI showed few small remote lacunar infarcts: Left greater than right basal ganglia no acute finding.  CT angio head and neck negative.  Lipid profile LDL 142 goal less than 70. HbA1c 6.4.  Follow-up echocardiogram with bubble studies, continue workup as per neurology await final recommendation regarding anticoagulation/antiplatelet: for now on aspirin and  Plavix.  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  Hypokalemia: Potassium very low being replaced aggressively.  Recheck Recent Labs  Lab 01/31/22 1436 02/01/22 0350 02/01/22 0510  K 3.9 2.7* 3.0*    Hypertension allow permissive hypertension.  Stable currently Type 2 diabetes mellitus: Appears well-controlled with HbA1c 6.4, continue diet control  Hyperlipidemia uncontrolled starting high intensity statin as above Mild hyponatremia: S/P Gentle IV fluids. Mildly elevated creatinine hovering at 1.2-1.3. monitor  DVT prophylaxis: enoxaparin (LOVENOX) injection 40 mg Start: 01/31/22 2200 Code Status:   Code Status: Full Code Family Communication: plan of care discussed with patient at bedside. Patient status is: admitted as observation but remains hospitalized for ongoing workup of his stroke, electrolyte replacement Level of care: Telemetry  Dispo: The patient is from: home            Anticipated disposition: home today Objective: Vitals last 24 hrs: Vitals:   02/01/22 0600 02/01/22 0642 02/01/22 0947 02/01/22 1000  BP: (!) 130/96  (!) 132/98 131/84  Pulse: 80  97 70  Resp: 18  18 16   Temp:  (!) 97.5 F (36.4 C)  98.2 F (36.8 C)  TempSrc:  Oral    SpO2: 100%  92% 98%   Weight change:   Physical Examination: General exam: alert awake, older than stated age HEENT:Oral mucosa moist, Ear/Nose WNL grossly Respiratory system: bilaterally clear BS, no use of accessory muscle Cardiovascular system: S1 & S2 +, No JVD. Gastrointestinal system: Abdomen soft,NT,ND, BS+ Nervous System:Alert, awake, moving extremities. Extremities: LE  edema neg,distal peripheral pulses palpable.  Skin: No rashes,no icterus. MSK: Normal muscle bulk,tone, power  Medications reviewed:  Scheduled Meds:   stroke: early stages of recovery  book   Does not apply Once   aspirin EC  81 mg Oral Daily   atorvastatin  80 mg Oral Daily   clopidogrel  75 mg Oral Daily   enoxaparin (LOVENOX) injection  40 mg Subcutaneous QHS   insulin aspart  0-9 Units Subcutaneous TID WC   Continuous Infusions:  sodium chloride 50 mL/hr at 02/01/22 0005      Diet Order             Diet Carb Modified Fluid consistency: Thin; Room service appropriate? Yes  Diet effective now                  No intake or output data in the 24 hours ending 02/01/22 1450 Net IO Since Admission: No IO data has been entered for this period [02/01/22 1450]  Wt Readings from Last 3 Encounters:  06/25/15 100.2 kg  03/30/15 97.5 kg  05/25/14 98.9 kg     Unresulted Labs (From admission, onward)     Start     Ordered   02/07/22 0500  Creatinine, serum  (enoxaparin (LOVENOX)    CrCl >/= 30 ml/min)  Weekly,   R     Comments: while on enoxaparin therapy    01/31/22 1743   02/01/22 0500  Basic metabolic panel  Daily,   R      01/31/22 1747   01/31/22 1743  HIV Antibody (routine testing w rflx)  (HIV Antibody (Routine testing w reflex) panel)  Once,   R        01/31/22 1743          Data Reviewed: I have personally reviewed following labs and imaging studies CBC: Recent Labs  Lab 01/31/22 1436  WBC 4.5  NEUTROABS 1.4*  HGB 15.2  HCT 44.1  MCV 89.5  PLT 187   Basic Metabolic Panel: Recent Labs  Lab 01/31/22 1436 02/01/22 0350 02/01/22 0510  NA 134* 136 135  K 3.9 2.7* 3.0*  CL 99 100 98  CO2 28 28 28   GLUCOSE 161* 157* 143*  BUN 17 15 15   CREATININE 1.26* 1.14 1.30*  CALCIUM 9.0 8.2* 9.3  MG  --   --  2.2   GFR: CrCl cannot be calculated (Unknown ideal weight.). Liver Function Tests: Recent Labs  Lab 01/31/22 1436 02/01/22 0510  AST 42* 29  ALT 27 22  ALKPHOS 64 56  BILITOT 1.3* 1.1  PROT 8.2* 6.8  ALBUMIN 4.5 3.7  HbA1C: Recent Labs    01/31/22 1608  HGBA1C 6.4*   CBG: Recent Labs  Lab 01/31/22 1425 02/01/22 0106  02/01/22 0746 02/01/22 1255  GLUCAP 156* 113* 122* 141*   Lipid Profile: Recent Labs    02/01/22 0350  CHOL 191  HDL 36*  LDLCALC 142*  TRIG 66  CHOLHDL 5.3   No results found for this or any previous visit (from the past 240 hour(s)).  Antimicrobials: Anti-infectives (From admission, onward)    None      Culture/Microbiology No results found for: "SDES", "SPECREQUEST", "CULT", "REPTSTATUS"  Radiology Studies: CT ANGIO HEAD NECK W WO CM  Result Date: 02/01/2022 CLINICAL DATA:  52 year old male TIA, dizziness, aphasia. Status post tPA. EXAM: CT ANGIOGRAPHY HEAD AND NECK TECHNIQUE: Multidetector CT imaging of the head and neck was performed using the standard protocol during bolus administration  of intravenous contrast. Multiplanar CT image reconstructions and MIPs were obtained to evaluate the vascular anatomy. Carotid stenosis measurements (when applicable) are obtained utilizing NASCET criteria, using the distal internal carotid diameter as the denominator. RADIATION DOSE REDUCTION: This exam was performed according to the departmental dose-optimization program which includes automated exposure control, adjustment of the mA and/or kV according to patient size and/or use of iterative reconstruction technique. CONTRAST:  43mL OMNIPAQUE IOHEXOL 350 MG/ML SOLN COMPARISON:  Brain MRI and head CT 01/31/2022. FINDINGS: CT HEAD Brain: Stable non contrast CT appearance of the brain. Posterior left corona radiata/lentiform lacunar infarct seen to be chronic by MRI. Calvarium and skull base: Negative. Paranasal sinuses: Visualized paranasal sinuses and mastoids are clear. Orbits: Visualized orbits and scalp soft tissues are within normal limits. CTA NECK Skeleton: Carious dentition on the right. Negative for age visible spine. No acute osseous abnormality identified. Upper chest: Negative. Other neck: Small volume retained secretions in the hypopharynx. Otherwise negative. Aortic arch: 3 vessel  arch configuration. No arch atherosclerosis. Small superior mediastinal prevascular space venous collaterals are enhancing, but the left innominate vein remains patent. Right carotid system: Negative. Left carotid system: Negative. Vertebral arteries: Negative proximal right subclavian and right vertebral artery. The right vertebral artery is dominant. Non dominant left vertebral artery arises from the proximal subclavian, and has mildly late entry into the cervical transverse foramen. It is diminutive but patent to the skull base. CTA HEAD Posterior circulation: Dominant right vertebral artery primarily supplies the basilar. Diminutive left vertebral artery has a small V4 segment beyond the normal left PICA origin. Right AICA appears to be dominant. No distal vertebral or basilar stenosis. Patent SCA and PCA origins, with fetal type bilateral PCAs. Bilateral PCA branches are within normal limits. Anterior circulation: Both ICA siphons are patent with no plaque or stenosis. Patent carotid termini, MCA and ACA origins. Normal posterior communicating artery origins. Normal anterior communicating artery, bilateral ECA branches. Left MCA M1 segment and trifurcation are patent without stenosis. Right MCA M1 segment and trifurcation are patent without stenosis. Bilateral MCA branches are within normal limits. Venous sinuses: Patent. Physiologic arachnoid granulation at the junction of the left transverse and sigmoid sinuses. Anatomic variants: Dominant right vertebral artery, the left functionally terminates in PICA. Fetal type bilateral PCAs. Review of the MIP images confirms the above findings IMPRESSION: 1. Negative CTA Head and Neck. No atherosclerosis or stenosis. 2. Stable CT appearance of the brain. Chronic left corona radiata/lentiform lacunar infarct. 3. Carious dentition. Electronically Signed   By: Genevie Ann M.D.   On: 02/01/2022 04:12   MR BRAIN WO CONTRAST  Result Date: 01/31/2022 CLINICAL DATA:  Follow-up  examination for stroke. EXAM: MRI HEAD WITHOUT CONTRAST TECHNIQUE: Multiplanar, multiecho pulse sequences of the brain and surrounding structures were obtained without intravenous contrast. COMPARISON:  Prior CT from earlier the same day. FINDINGS: Brain: Cerebral volume within normal limits. Few small remote lacunar infarcts present about the left greater than right basal ganglia. No significant cerebral white matter disease for age. No evidence for acute or subacute ischemia. Gray-white matter differentiation maintained. No other areas of chronic cortical infarction. No acute or chronic intracranial blood products. No mass lesion, midline shift or mass effect. No hydrocephalus or extra-axial fluid collection. Pituitary gland and suprasellar region within normal limits. Vascular: Major intracranial vascular flow voids are maintained. Skull and upper cervical spine: Craniocervical junction normal. Bone marrow signal intensity within normal limits. No scalp soft tissue abnormality. Sinuses/Orbits: Globes and orbital soft tissues within  normal limits. Paranasal sinuses are largely clear. No mastoid effusion. Other: None. IMPRESSION: 1. No acute intracranial abnormality. 2. Few small remote lacunar infarcts about the left greater than right basal ganglia. Electronically Signed   By: Jeannine Boga M.D.   On: 01/31/2022 19:13   CT Head Wo Contrast  Result Date: 01/31/2022 CLINICAL DATA:  TIA.  Dizziness.  Aphasia. EXAM: CT HEAD WITHOUT CONTRAST TECHNIQUE: Contiguous axial images were obtained from the base of the skull through the vertex without intravenous contrast. RADIATION DOSE REDUCTION: This exam was performed according to the departmental dose-optimization program which includes automated exposure control, adjustment of the mA and/or kV according to patient size and/or use of iterative reconstruction technique. COMPARISON:  None Available. FINDINGS: Brain: No subdural, epidural, or subarachnoid  hemorrhage. A lacunar infarct is identified in the left basal ganglia on series 2, image 16. No other evidence of ischemia or infarct. No mass effect or midline shift. Ventricles and sulci are unremarkable. Cerebellum, brainstem, and basal cisterns are normal. Vascular: No hyperdense vessel or unexpected calcification. Skull: Normal. Negative for fracture or focal lesion. Sinuses/Orbits: No acute finding. Other: None. IMPRESSION: 1. There is a lacunar infarct in the left basal ganglia which is age indeterminate. No other evidence of ischemia or infarct. 2. No other abnormalities. Electronically Signed   By: Dorise Bullion III M.D.   On: 01/31/2022 15:46     LOS: 0 days   Antonieta Pert, MD [facility]  02/01/2022, 2:50 PM

## 2022-02-01 NOTE — Plan of Care (Signed)
Chart check as patient is not able to be transferred to [facility].  52 year old with TIA.  MRI negative for acute stroke but does show remote strokes mostly lacunar.  History of pulmonary embolus with COVID on Eliquis but noncompliant.  Goal LDL less than 70 and needs to be on a statin high intensity.  Echo was unremarkable.  Given no stroke on MRI no further workup is needed.  Recommend DAPT therapy for 3 weeks then aspirin alone 81 mg.  Follow-up in stroke clinic on discharge with Dr. Leonie Man at [facility] for further consideration of hypercoagulable workup.

## 2022-02-01 NOTE — Evaluation (Signed)
Physical Therapy Evaluation Patient Details Name: Andre Castro MRN: [id number] DOB: Aug 12, 1970 Today's Date: 02/01/2022  History of Present Illness  Patietn is 52 y.o. male presented to the emergency room for complaints of slurred speech and dizziness that happened at 2 PM on 01/31/2022 and lasted a few minutes.  Symptom had resolved by the time of arrival.  No code stroke was activated. He was admitted for TIA workup given his prior history of stroke and focal deficits that were transiently present.  He drove himself to the emergency room. MRI brain-negative for stroke.  Few small lacunar infarctions in the basal ganglia bilaterally. PMH significant of  PE, DVT, prior subacute left lentiform nucleus and corona radiata infarct in the setting of COVID-19 infection at that time, who was on Eliquis and has been noncompliant with the medication.   Clinical Impression  Andre Castro is 52 y.o. male admitted with above HPI and diagnosis. Patient is currently limited by functional impairments below (see PT problem list). Patient lives alone and is independent and working full time at [employer] at baseline. Patient evaluated by Physical Therapy with no further acute PT needs identified. All education has been completed and the patient has no further questions. He is functioning at independent level for transfers and gait, no obvious strength, balance, or coordination deficits and denies dizziness and numbness. See below for any follow-up Physical Therapy or equipment needs. PT is signing off. Thank you for this referral.        Recommendations for follow up therapy are one component of a multi-disciplinary discharge planning process, led by the attending physician.  Recommendations may be updated based on patient status, additional functional criteria and insurance authorization.  Follow Up Recommendations No PT follow up      Assistance Recommended at Discharge PRN  Patient can return home with the  following       Equipment Recommendations None recommended by PT  Recommendations for Other Services       Functional Status Assessment Patient has not had a recent decline in their functional status     Precautions / Restrictions Precautions Precautions: Fall Restrictions Weight Bearing Restrictions: No      Mobility  Bed Mobility Overal bed mobility: Independent                  Transfers Overall transfer level: Independent                      Ambulation/Gait Ambulation/Gait assistance: Modified independent (Device/Increase time) Gait Distance (Feet): 80 Feet Assistive device: IV Pole Gait Pattern/deviations: WFL(Within Functional Limits), Staggering right, Drifts right/left Gait velocity: decr     General Gait Details: occasional slight drift Rt, no true LOB. overall steady and WFL's.  Stairs            Wheelchair Mobility    Modified Rankin (Stroke Patients Only)       Balance Overall balance assessment: Needs assistance Sitting-balance support: Feet supported Sitting balance-Leahy Scale: Good     Standing balance support: Reliant on assistive device for balance, During functional activity, Bilateral upper extremity supported Standing balance-Leahy Scale: Good                               Pertinent Vitals/Pain      Home Living Family/patient expects to be discharged to:: Private residence  Prior Function Prior Level of Function : Independent/Modified Independent;Driving;Working/employed                     Hand Dominance        Extremity/Trunk Assessment   Upper Extremity Assessment Upper Extremity Assessment: Overall WFL for tasks assessed    Lower Extremity Assessment Lower Extremity Assessment: Overall WFL for tasks assessed    Cervical / Trunk Assessment Cervical / Trunk Assessment: Normal  Communication   Communication: No difficulties  Cognition  Arousal/Alertness: Awake/alert Behavior During Therapy: WFL for tasks assessed/performed Overall Cognitive Status: Within Functional Limits for tasks assessed                                          General Comments      Exercises     Assessment/Plan    PT Assessment Patient does not need any further PT services  PT Problem List Decreased mobility;Decreased balance;Decreased activity tolerance;Decreased knowledge of use of DME;Decreased safety awareness;Decreased knowledge of precautions       PT Treatment Interventions DME instruction;Gait training;Stair training;Functional mobility training;Therapeutic activities;Therapeutic exercise;Balance training;Patient/family education    PT Goals (Current goals can be found in the Care Plan section)  Acute Rehab PT Goals Patient Stated Goal: remain healthy PT Goal Formulation: All assessment and education complete, DC therapy Time For Goal Achievement: 02/02/22 Potential to Achieve Goals: Good    Frequency Other (Comment) (1x eval)     Co-evaluation               AM-PAC PT "6 Clicks" Mobility  Outcome Measure Help needed turning from your back to your side while in a flat bed without using bedrails?: None Help needed moving from lying on your back to sitting on the side of a flat bed without using bedrails?: None Help needed moving to and from a bed to a chair (including a wheelchair)?: None Help needed standing up from a chair using your arms (e.g., wheelchair or bedside chair)?: None Help needed to walk in hospital room?: None Help needed climbing 3-5 steps with a railing? : A Little 6 Click Score: 23    End of Session Equipment Utilized During Treatment: Gait belt Activity Tolerance: Patient tolerated treatment well Patient left: in bed (echo at bedside) Nurse Communication: Mobility status PT Visit Diagnosis: Muscle weakness (generalized) (M62.81);Difficulty in walking, not elsewhere classified  (R26.2);Other abnormalities of gait and mobility (R26.89);Unsteadiness on feet (R26.81)    Time: 1610-9604 PT Time Calculation (min) (ACUTE ONLY): 14 min   Charges:   PT Evaluation $PT Eval Low Complexity: 1 Low          Verner Mould, DPT Acute Rehabilitation Services Office [phone]  02/01/22 1:15 PM

## 2022-02-01 NOTE — Progress Notes (Signed)
OT Cancellation Note  Patient Details Name: Andre Castro MRN: [id number] DOB: 07-Aug-1970   Cancelled Treatment:    Reason Eval/Treat Not Completed: OT screened, no needs identified, will sign off  Mihailo Sage L Alejandro Adcox 02/01/2022, 1:37 PM

## 2022-02-02 ENCOUNTER — Observation Stay (HOSPITAL_BASED_OUTPATIENT_CLINIC_OR_DEPARTMENT_OTHER): Payer: BC Managed Care – PPO

## 2022-02-02 DIAGNOSIS — I2699 Other pulmonary embolism without acute cor pulmonale: Secondary | ICD-10-CM | POA: Diagnosis not present

## 2022-02-02 LAB — BASIC METABOLIC PANEL
Anion gap: 10 (ref 5–15)
BUN: 16 mg/dL (ref 6–20)
CO2: 25 mmol/L (ref 22–32)
Calcium: 9 mg/dL (ref 8.9–10.3)
Chloride: 99 mmol/L (ref 98–111)
Creatinine, Ser: 1.36 mg/dL — ABNORMAL HIGH (ref 0.61–1.24)
GFR, Estimated: >60 mL/min (ref >60)
Glucose, Bld: 108 mg/dL — ABNORMAL HIGH (ref 70–99)
Potassium: 3.1 mmol/L — ABNORMAL LOW (ref 3.5–5.1)
Sodium: 134 mmol/L — ABNORMAL LOW (ref 135–145)

## 2022-02-02 LAB — MAGNESIUM: Magnesium: 2.1 mg/dL (ref 1.7–2.4)

## 2022-02-02 LAB — GLUCOSE, CAPILLARY
Glucose-Capillary: 102 mg/dL — ABNORMAL HIGH (ref 70–99)
Glucose-Capillary: 139 mg/dL — ABNORMAL HIGH (ref 70–99)
Glucose-Capillary: 160 mg/dL — ABNORMAL HIGH (ref 70–99)

## 2022-02-02 LAB — POTASSIUM: Potassium: 3.7 mmol/L (ref 3.5–5.1)

## 2022-02-02 LAB — D-DIMER, QUANTITATIVE: D-Dimer, Quant: <0.27 ug/mL-FEU (ref 0.00–0.50)

## 2022-02-02 MED ORDER — ATORVASTATIN CALCIUM 80 MG PO TABS: 80.0000 mg | ORAL_TABLET | Freq: Every day | ORAL | 0 refills | Status: DC

## 2022-02-02 MED ORDER — POTASSIUM CHLORIDE 10 MEQ/100ML IV SOLN
10.0000 meq | INTRAVENOUS | Status: AC
  Administered 2022-02-02 (×2): 10 meq via INTRAVENOUS
  Filled 2022-02-02 (×2): qty 100

## 2022-02-02 MED ORDER — ASPIRIN 81 MG PO TBEC: 81.0000 mg | DELAYED_RELEASE_TABLET | Freq: Every day | ORAL | 12 refills | Status: AC

## 2022-02-02 MED ORDER — POTASSIUM CHLORIDE CRYS ER 20 MEQ PO TBCR
40.0000 meq | EXTENDED_RELEASE_TABLET | ORAL | Status: AC
  Administered 2022-02-02 (×2): 40 meq via ORAL
  Filled 2022-02-02 (×2): qty 2

## 2022-02-02 MED ORDER — CLOPIDOGREL BISULFATE 75 MG PO TABS: 75.0000 mg | ORAL_TABLET | Freq: Every day | ORAL | 0 refills | Status: AC

## 2022-02-02 NOTE — Discharge Summary (Signed)
Physician Discharge Summary  Ace GinsGeorge E Sumler ZOX:[id number] DOB: 1970-09-03 DOA: 01/31/2022  PCP: Patient, No Pcp Per  Admit date: 01/31/2022 Discharge date: 02/02/2022 Recommendations for Outpatient Follow-up:  Follow up with PCP in 1 weeks-call for appointment Please obtain BMP/CBC in one week Fu w/ hematology, pcp, neurology as outpatient  Discharge Dispo: Home Discharge Condition: Stable Code Status:   Code Status: Full Code Diet recommendation:  Diet Order             Diet Carb Modified Fluid consistency: Thin; Room service appropriate? 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 echo in June/05/2018: normal EF, positive for atrial shunting in saline microcavitation,rectal polyp presented to the ED brief episode of slurred speech and dizziness and left arm numbness around 2 PM today lasting for little less than a minute on presentation his symptoms. In the ED hypertensive 160/110, afebrile.  Labs mild hyponatremia mild elevation with creatinine, TB 1.3 normal CBC glucose 161, was nonfocal on examination, CT scan showed a lacunar infarct in the left basilar ganglia age-indeterminate, neurology was consulted w/ Dr Otelia LimesLindzen and advised admission for TIA workup. Last office visit was in March 09, 2016 2022 supposed to be on HCTZ 12.5 mg. He has been discharged by neurology due to no-show has been taking medication intermittently and mostly noncompliant.  He was seen In ED 09/25/2021 w/ thrombosed hemorrhoids> he was given refills on his BP meds only.  Ghe saw PCP Susa RaringKelly Delany, MD - 03/22/2020 3:30  [facility]  who highlighted "He recently restarted his Eliquis for unclear reasons after self-discontinuing (and at only 5 mg daily inconsistently). He was counseled that taking it once daily offered very little stroke protection if any and that he should be taking it 5mg  BID as prescribed (given his h/o PE) or not at all and take ASA instead. Per chart review, he has had difficulty with medication compliance and has a history of self-discontinuing medications. He was previously prescribed atorvastatin, but he had concerns about myalgias and self-discontinued it. Previously declined resumption of statin. Lipid panel in 05/2019 showed LDL 130. He should be on a high-intensity statin and ASA for secondary prevention (although he has been taking Eliquis this week). He declined a repeat lipid panel at today's visit. Patient is scheduled to be seen in neurology clinic next month on 05/01/20.Recommend that he be placed on a high-intensity statin and ASA for secondary prevention at that visit (if adhering to Eliquis BID dosing is not realistic) and if neurology team agrees"  Patient was admitted for TIA workup:MRI showed few small remote lacunar infarcts: Left greater than right basal ganglia no acute finding.  CT angio head and neck negative.  Lipid profile LDL 142 goal less than 70. HbA1c 6.4.  Tolerating high intensity statin.  Echocardiogram with normal EF, PRN saline contrast was given intravenously to evaluate for intracardiac shunting and was negative with no evidence of any interatrial shunt, cannot exclude a small PFO.  Discussed with neurology again 02/03/2023 with Dr. Francetta FoundS Khliqdina: advised D Dimer/Duplex of leg-work up negative and discussed w/  neuro> advised to discahrge home on DAPT x 3 weeks then aspirin alone and follow-up with the stroke clinic with Dr. Pearlean Brownie at [facility] for further consideration of hypercoagulable workup/plan.    Other problems below  Discharge Diagnoses:  Principal Problem:   TIA (transient ischemic  attack) Active Problems:   Diabetes mellitus type 2, uncomplicated (HCC)   History of colonic polyps  TIA:see above. Hypokalemia that was aggressively replaced. 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 Alos given hematology no for outpatient follow up. Patient prefers not to be on anticoagulation when discussed.  Hypertension -currently well-controlled . Can start low dose amlodipin as outpatinet. Stop chlorthalidone due to patient's persistent hypokalemia  Type 2 diabetes mellitus: Appears well-controlled with HbA1c 6.4, continue diet control  Hyperlipidemia uncontrolled started high intensity statin> no myalgias reported Mild hyponatremia: S/P Gentle IV fluids. Mildly elevated creatinine hovering at 1.2-1.3. monitorssure Ulcer:    Consults: neurology Subjective: Aaox3, no complaints  Discharge Exam: Vitals:   02/02/22 0353 02/02/22 1301  BP: (!) 141/74 132/81  Pulse: 80 76  Resp: 20 18  Temp: 98 F (36.7 C) 97.6 F (36.4 C)  SpO2: 97% 99%   General: Pt is alert, awake, not in acute distress Cardiovascular: RRR, S1/S2 +, no rubs, no gallops Respiratory: CTA bilaterally, no wheezing, no rhonchi Abdominal: Soft, NT, ND, bowel sounds + Extremities: no edema, no cyanosis  Discharge Instructions  Discharge Instructions     Ambulatory referral to Neurology   Complete by: As directed    An appointment is requested in approximately: 2 weeks.  For hypercoagulable workup and post TIA follow-up   Discharge instructions   Complete by: As directed    Please call call MD or return to ER for similar or worsening recurring problem that brought you to hospital or if any fever,nausea/vomiting,abdominal pain, uncontrolled pain, chest pain,  shortness of breath or any other alarming symptoms.  Please follow-up your doctor as instructed in a week time and  call the office for appointment. Follow-up with neurology to consider hypercoagulable workup Please avoid alcohol, smoking, or any other illicit substance and maintain healthy habits including taking your regular medications as prescribed.  You were cared for by a hospitalist during your hospital stay. If you have any questions about your discharge medications or the care you received while you were in the hospital after you are discharged, you can call the unit and ask to speak with the hospitalist on call if the hospitalist that took care of you is not available.  Once you are discharged, your primary care physician will handle any further medical issues. Please note that NO REFILLS for any discharge medications will be authorized once you are discharged, as it is imperative that you return to your primary care physician (or establish a relationship with a primary care physician if you do not have one) for your aftercare needs so that they can reassess your need for medications and monitor your lab values   Increase activity slowly   Complete by: As directed       Allergies as of 02/02/2022   No Known Allergies      Medication List     STOP taking these medications    benzonatate 100 MG capsule Commonly known as: TESSALON   cetirizine 10 MG tablet Commonly known as: ZYRTEC   chlorthalidone 25  MG tablet Commonly known as: HYGROTON   fluticasone 50 MCG/ACT nasal spray Commonly known as: FLONASE   guaiFENesin 600 MG 12 hr tablet Commonly known as: MUCINEX   hydrocortisone 25 MG suppository Commonly known as: ANUSOL-HC       TAKE these medications    aspirin EC 81 MG tablet Take 1 tablet (81 mg total) by mouth daily. Swallow whole. Start taking on: February 03, 2022 What changed:  how much to take when to take this reasons to take this   atorvastatin 80 MG tablet Commonly known as: LIPITOR Take 1 tablet (80 mg total) by mouth daily. Start taking on: February 03, 2022   clopidogrel 75 MG tablet Commonly known as: PLAVIX Take 1 tablet (75 mg total) by mouth daily for 21 days. Start taking on: February 03, 2022   FOCUSED MIND PO Take 1 capsule by mouth in the morning and at bedtime. Alpha Brain        Follow-up Information     [facility] Follow up in 1 week(s).   Contact information: [address] [place] [address] [phone]        Micki Riley, MD Follow up in 1 week(s).   Specialties: Neurology, Radiology Contact information: [address] [place] [address] [phone]                No Known Allergies  The results of significant diagnostics from this hospitalization (including imaging, microbiology, ancillary and laboratory) are listed below for reference.    Microbiology: No results found for this or any previous visit (from the past 240 hour(s)).  Procedures/Studies: VAS [place] LOWER EXTREMITY VENOUS (DVT)  Result Date: 02/02/2022  Lower Venous DVT Study Patient Name:  ESTABAN MAINVILLE  Date of Exam:   02/02/2022 Medical Rec #: [id number]          Accession #:    [id number] Date of Birth: 24-Jun-1970          Patient Gender: M Patient Age:   52 years Exam Location:  [facility] Procedure:      VAS [place] LOWER EXTREMITY VENOUS (DVT) Referring Phys: Zamirah Denny --------------------------------------------------------------------------------  Indications: Pulmonary embolism.  Risk Factors: None identified. Comparison Study: No prior studies. Performing Technologist: Chanda Busing RVT  Examination Guidelines: A complete evaluation includes B-mode imaging, spectral Doppler, color Doppler, and power Doppler as needed of all accessible portions of each vessel. Bilateral testing is considered an integral part of a complete examination. Limited examinations for reoccurring indications may be performed as noted. The reflux portion of the exam is  performed with the patient in reverse Trendelenburg.  +---------+---------------+---------+-----------+----------+--------------+ RIGHT    CompressibilityPhasicitySpontaneityPropertiesThrombus Aging +---------+---------------+---------+-----------+----------+--------------+ CFV      Full           Yes      Yes                                 +---------+---------------+---------+-----------+----------+--------------+ SFJ      Full                                                        +---------+---------------+---------+-----------+----------+--------------+ FV Prox  Full                                                        +---------+---------------+---------+-----------+----------+--------------+  FV Mid   Full                                                        +---------+---------------+---------+-----------+----------+--------------+ FV DistalFull                                                        +---------+---------------+---------+-----------+----------+--------------+ PFV      Full                                                        +---------+---------------+---------+-----------+----------+--------------+ POP      Full           Yes      Yes                                 +---------+---------------+---------+-----------+----------+--------------+ PTV      Full                                                        +---------+---------------+---------+-----------+----------+--------------+ PERO     Full                                                        +---------+---------------+---------+-----------+----------+--------------+   +---------+---------------+---------+-----------+----------+--------------+ LEFT     CompressibilityPhasicitySpontaneityPropertiesThrombus Aging +---------+---------------+---------+-----------+----------+--------------+ CFV      Full           Yes      Yes                                  +---------+---------------+---------+-----------+----------+--------------+ SFJ      Full                                                        +---------+---------------+---------+-----------+----------+--------------+ FV Prox  Full                                                        +---------+---------------+---------+-----------+----------+--------------+ FV Mid   Full                                                        +---------+---------------+---------+-----------+----------+--------------+  FV DistalFull                                                        +---------+---------------+---------+-----------+----------+--------------+ PFV      Full                                                        +---------+---------------+---------+-----------+----------+--------------+ POP      Full           Yes      Yes                                 +---------+---------------+---------+-----------+----------+--------------+ PTV      Full                                                        +---------+---------------+---------+-----------+----------+--------------+ PERO     Full                                                        +---------+---------------+---------+-----------+----------+--------------+    Summary: RIGHT: - There is no evidence of deep vein thrombosis in the lower extremity.  - No cystic structure found in the popliteal fossa.  LEFT: - There is no evidence of deep vein thrombosis in the lower extremity.  - No cystic structure found in the popliteal fossa.  *See table(s) above for measurements and observations.    Preliminary    ECHOCARDIOGRAM COMPLETE BUBBLE STUDY  Result Date: 02/01/2022    ECHOCARDIOGRAM REPORT   Patient Name:   SMOKEY MELOTT Date of Exam: 02/01/2022 Medical Rec #:  [id number]         Height:       70.0 in Accession #:    [id number]        Weight:       221.0 lb Date of Birth:  1970/06/17         BSA:           2.178 m Patient Age:    51 years          BP:           125/77 mmHg Patient Gender: M                 HR:           85 bpm. Exam Location:  Inpatient Procedure: 2D Echo, Cardiac Doppler, Color Doppler and Saline Contrast Bubble            Study Indications:    Stroke 434.91 / I63.9  History:        Patient has prior history of Echocardiogram examinations, most                 recent 06/10/2018.  Sonographer:  Leta Junglingiffany Cooper RDCS Referring Phys: [id number] Destane Speas IMPRESSIONS  1. Left ventricular ejection fraction, by estimation, is 60 to 65%. The left ventricle has normal function. The left ventricle has no regional wall motion abnormalities. There is mild left ventricular hypertrophy of the basal-septal segment. Left ventricular diastolic parameters are consistent with Grade I diastolic dysfunction (impaired relaxation). The impaired relaxation pattern is most likely due to relative hypovolemia, not due to impaired myocardial diastolic function  2. Right ventricular systolic function is normal. The right ventricular size is normal.  3. The mitral valve is grossly normal. No evidence of mitral valve regurgitation. No evidence of mitral stenosis.  4. The aortic valve is tricuspid. Aortic valve regurgitation is not visualized. No aortic stenosis is present.  5. The inferior vena cava is normal in size with greater than 50% respiratory variability, suggesting right atrial pressure of 3 mmHg.  6. Cannot exclude a small PFO. Agitated saline contrast bubble study was negative, with no evidence of any interatrial shunt. FINDINGS  Left Ventricle: Left ventricular ejection fraction, by estimation, is 60 to 65%. The left ventricle has normal function. The left ventricle has no regional wall motion abnormalities. The left ventricular internal cavity size was normal in size. There is  mild left ventricular hypertrophy of the basal-septal segment. Left ventricular diastolic parameters are consistent with Grade I  diastolic dysfunction (impaired relaxation). Right Ventricle: The right ventricular size is normal. No increase in right ventricular wall thickness. Right ventricular systolic function is normal. Left Atrium: Left atrial size was normal in size. Right Atrium: Right atrial size was normal in size. Pericardium: There is no evidence of pericardial effusion. Mitral Valve: The mitral valve is grossly normal. No evidence of mitral valve regurgitation. No evidence of mitral valve stenosis. Tricuspid Valve: The tricuspid valve is grossly normal. Tricuspid valve regurgitation is mild . No evidence of tricuspid stenosis. Aortic Valve: The aortic valve is tricuspid. Aortic valve regurgitation is not visualized. No aortic stenosis is present. Pulmonic Valve: The pulmonic valve was grossly normal. Pulmonic valve regurgitation is mild. No evidence of pulmonic stenosis. Aorta: The aortic root and ascending aorta are structurally normal, with no evidence of dilitation. Venous: The inferior vena cava is normal in size with greater than 50% respiratory variability, suggesting right atrial pressure of 3 mmHg. IAS/Shunts: Cannot exclude a small PFO. Agitated saline contrast was given intravenously to evaluate for intracardiac shunting. Agitated saline contrast bubble study was negative, with no evidence of any interatrial shunt.  LEFT VENTRICLE PLAX 2D LVIDd:         4.30 cm   Diastology LVIDs:         2.50 cm   LV e' medial:    10.20 cm/s LV PW:         1.00 cm   LV E/e' medial:  3.4 LV IVS:        1.50 cm   LV e' lateral:   10.90 cm/s LVOT diam:     2.00 cm   LV E/e' lateral: 3.1 LV SV:         53 LV SV Index:   24 LVOT Area:     3.14 cm  RIGHT VENTRICLE RV S prime:     12.20 cm/s TAPSE (M-mode): 1.8 cm LEFT ATRIUM             Index        RIGHT ATRIUM           Index LA Vol (A2C):  37.5 ml 17.22 ml/m  RA Area:     13.70 cm LA Vol (A4C):   21.1 ml 9.69 ml/m   RA Volume:   32.20 ml  14.79 ml/m LA Biplane Vol: 28.0 ml 12.86 ml/m   AORTIC VALVE             PULMONIC VALVE LVOT Vmax:   98.60 cm/s  PR End Diast Vel: 5.29 msec LVOT Vmean:  62.000 cm/s LVOT VTI:    0.169 m  AORTA Ao Root diam: 2.70 cm Ao Asc diam:  2.80 cm MITRAL VALVE               TRICUSPID VALVE MV Area (PHT): 2.46 cm    TR Peak grad:   15.1 mmHg MV Decel Time: 309 msec    TR Vmax:        194.00 cm/s MV E velocity: 34.30 cm/s MV A velocity: 46.20 cm/s  SHUNTS MV E/A ratio:  0.74        Systemic VTI:  0.17 m                            Systemic Diam: 2.00 cm Rachelle HoraMihai Croitoru MD Electronically signed by Thurmon FairMihai Croitoru MD Signature Date/Time: 02/01/2022/3:08:08 PM    Final    CT ANGIO HEAD NECK W WO CM  Result Date: 02/01/2022 CLINICAL DATA:  52 year old male TIA, dizziness, aphasia. Status post tPA. EXAM: CT ANGIOGRAPHY HEAD AND NECK TECHNIQUE: Multidetector CT imaging of the head and neck was performed using the standard protocol during bolus administration of intravenous contrast. Multiplanar CT image reconstructions and MIPs were obtained to evaluate the vascular anatomy. Carotid stenosis measurements (when applicable) are obtained utilizing NASCET criteria, using the distal internal carotid diameter as the denominator. RADIATION DOSE REDUCTION: This exam was performed according to the departmental dose-optimization program which includes automated exposure control, adjustment of the mA and/or kV according to patient size and/or use of iterative reconstruction technique. CONTRAST:  75mL OMNIPAQUE IOHEXOL 350 MG/ML SOLN COMPARISON:  Brain MRI and head CT 01/31/2022. FINDINGS: CT HEAD Brain: Stable non contrast CT appearance of the brain. Posterior left corona radiata/lentiform lacunar infarct seen to be chronic by MRI. Calvarium and skull base: Negative. Paranasal sinuses: Visualized paranasal sinuses and mastoids are clear. Orbits: Visualized orbits and scalp soft tissues are within normal limits. CTA NECK Skeleton: Carious dentition on the right. Negative for age visible  spine. No acute osseous abnormality identified. Upper chest: Negative. Other neck: Small volume retained secretions in the hypopharynx. Otherwise negative. Aortic arch: 3 vessel arch configuration. No arch atherosclerosis. Small superior mediastinal prevascular space venous collaterals are enhancing, but the left innominate vein remains patent. Right carotid system: Negative. Left carotid system: Negative. Vertebral arteries: Negative proximal right subclavian and right vertebral artery. The right vertebral artery is dominant. Non dominant left vertebral artery arises from the proximal subclavian, and has mildly late entry into the cervical transverse foramen. It is diminutive but patent to the skull base. CTA HEAD Posterior circulation: Dominant right vertebral artery primarily supplies the basilar. Diminutive left vertebral artery has a small V4 segment beyond the normal left PICA origin. Right AICA appears to be dominant. No distal vertebral or basilar stenosis. Patent SCA and PCA origins, with fetal type bilateral PCAs. Bilateral PCA branches are within normal limits. Anterior circulation: Both ICA siphons are patent with no plaque or stenosis. Patent carotid termini, MCA and ACA origins. Normal posterior communicating artery origins. Normal  anterior communicating artery, bilateral ECA branches. Left MCA M1 segment and trifurcation are patent without stenosis. Right MCA M1 segment and trifurcation are patent without stenosis. Bilateral MCA branches are within normal limits. Venous sinuses: Patent. Physiologic arachnoid granulation at the junction of the left transverse and sigmoid sinuses. Anatomic variants: Dominant right vertebral artery, the left functionally terminates in PICA. Fetal type bilateral PCAs. Review of the MIP images confirms the above findings IMPRESSION: 1. Negative CTA Head and Neck. No atherosclerosis or stenosis. 2. Stable CT appearance of the brain. Chronic left corona radiata/lentiform  lacunar infarct. 3. Carious dentition. Electronically Signed   By: Genevie Ann M.D.   On: 02/01/2022 04:12   MR BRAIN WO CONTRAST  Result Date: 01/31/2022 CLINICAL DATA:  Follow-up examination for stroke. EXAM: MRI HEAD WITHOUT CONTRAST TECHNIQUE: Multiplanar, multiecho pulse sequences of the brain and surrounding structures were obtained without intravenous contrast. COMPARISON:  Prior CT from earlier the same day. FINDINGS: Brain: Cerebral volume within normal limits. Few small remote lacunar infarcts present about the left greater than right basal ganglia. No significant cerebral white matter disease for age. No evidence for acute or subacute ischemia. Gray-white matter differentiation maintained. No other areas of chronic cortical infarction. No acute or chronic intracranial blood products. No mass lesion, midline shift or mass effect. No hydrocephalus or extra-axial fluid collection. Pituitary gland and suprasellar region within normal limits. Vascular: Major intracranial vascular flow voids are maintained. Skull and upper cervical spine: Craniocervical junction normal. Bone marrow signal intensity within normal limits. No scalp soft tissue abnormality. Sinuses/Orbits: Globes and orbital soft tissues within normal limits. Paranasal sinuses are largely clear. No mastoid effusion. Other: None. IMPRESSION: 1. No acute intracranial abnormality. 2. Few small remote lacunar infarcts about the left greater than right basal ganglia. Electronically Signed   By: Jeannine Boga M.D.   On: 01/31/2022 19:13   CT Head Wo Contrast  Result Date: 01/31/2022 CLINICAL DATA:  TIA.  Dizziness.  Aphasia. EXAM: CT HEAD WITHOUT CONTRAST TECHNIQUE: Contiguous axial images were obtained from the base of the skull through the vertex without intravenous contrast. RADIATION DOSE REDUCTION: This exam was performed according to the departmental dose-optimization program which includes automated exposure control, adjustment of the  mA and/or kV according to patient size and/or use of iterative reconstruction technique. COMPARISON:  None Available. FINDINGS: Brain: No subdural, epidural, or subarachnoid hemorrhage. A lacunar infarct is identified in the left basal ganglia on series 2, image 16. No other evidence of ischemia or infarct. No mass effect or midline shift. Ventricles and sulci are unremarkable. Cerebellum, brainstem, and basal cisterns are normal. Vascular: No hyperdense vessel or unexpected calcification. Skull: Normal. Negative for fracture or focal lesion. Sinuses/Orbits: No acute finding. Other: None. IMPRESSION: 1. There is a lacunar infarct in the left basal ganglia which is age indeterminate. No other evidence of ischemia or infarct. 2. No other abnormalities. Electronically Signed   By: Dorise Bullion III M.D.   On: 01/31/2022 15:46    Labs: BNP (last 3 results) No results for input(s): "BNP" in the last 8760 hours. Basic Metabolic Panel: Recent Labs  Lab 01/31/22 1436 02/01/22 0350 02/01/22 0510 02/02/22 0435 02/02/22 1235  NA 134* 136 135 134*  --   K 3.9 2.7* 3.0* 3.1* 3.7  CL 99 100 98 99  --   CO2 28 28 28 25   --   GLUCOSE 161* 157* 143* 108*  --   BUN 17 15 15 16   --   CREATININE  1.26* 1.14 1.30* 1.36*  --   CALCIUM 9.0 8.2* 9.3 9.0  --   MG  --   --  2.2 2.1  --    Liver Function Tests: Recent Labs  Lab 01/31/22 1436 02/01/22 0510  AST 42* 29  ALT 27 22  ALKPHOS 64 56  BILITOT 1.3* 1.1  PROT 8.2* 6.8  ALBUMIN 4.5 3.7   No results for input(s): "LIPASE", "AMYLASE" in the last 168 hours. No results for input(s): "AMMONIA" in the last 168 hours. CBC: Recent Labs  Lab 01/31/22 1436  WBC 4.5  NEUTROABS 1.4*  HGB 15.2  HCT 44.1  MCV 89.5  PLT 187  CBG: Recent Labs  Lab 02/01/22 1255 02/01/22 1643 02/01/22 2124 02/02/22 0727 02/02/22 1151  GLUCAP 141* 151* 109* 102* 139*   D-Dimer Recent Labs    02/02/22 1531  DDIMER <0.27   Hgb A1c Recent Labs     01/31/22 1608  HGBA1C 6.4*   Lipid Profile Recent Labs    02/01/22 0350  CHOL 191  HDL 36*  LDLCALC 142*  TRIG 66  CHOLHDL 5.3   Thyroid function studies No results for input(s): "TSH", "T4TOTAL", "T3FREE", "THYROIDAB" in the last 72 hours.  Invalid input(s): "FREET3" Anemia work up No results for input(s): "VITAMINB12", "FOLATE", "FERRITIN", "TIBC", "IRON", "RETICCTPCT" in the last 72 hours. Urinalysis    Component Value Date/Time   BILIRUBINUR neg 05/25/2014 1415   PROTEINUR neg 05/25/2014 1415   UROBILINOGEN 0.2 05/25/2014 1415   NITRITE neg 05/25/2014 1415   LEUKOCYTESUR Negative 05/25/2014 1415   Sepsis Labs Recent Labs  Lab 01/31/22 1436  WBC 4.5   Microbiology No results found for this or any previous visit (from the past 240 hour(s)).  Time coordinating discharge: 25 minutes  SIGNED: Antonieta Pert, MD  [facility] 02/02/2022, 4:38 PM  If 7PM-7AM, please contact night-coverage [email or web address]

## 2022-02-02 NOTE — Progress Notes (Signed)
Briefly, Mr. Andre Castro is admitted with TIA. MRI shows remote lacunar stroke but no acute strokes. History of PE but not on AC at this time. Prior TTE at [facility] in 2020 with concern for a small PFO, cannot exclude small PFO on current Echo. Vessel imaging with no significant abnormalities.  Started on DAPT x 3 weeks, then Aspirin alone. Had BL lower ext dopplers and D-dimer which are negative. Had hypercoagulable workup in june 2020 which was negative. Overall, low suspicion for a paradoxical embolic as the etiology of TIA specially with negative D-dimer.  Agree with DAPT x 3 weeks, then aspirin 81mg  daily alone.  [place] Pager Number [phone]

## 2022-02-02 NOTE — Evaluation (Signed)
Speech Language Pathology Evaluation Patient Details Name: Andre Castro MRN: [id number] DOB: 01/15/1970 Today's Date: 02/02/2022 Time: 4098-1191 SLP Time Calculation (min) (ACUTE ONLY): 10 min  Problem List:  Patient Active Problem List   Diagnosis Date Noted   TIA (transient ischemic attack) 01/31/2022   Diabetes mellitus type 2, uncomplicated ([place]) 47/82/9562   History of colonic polyps 06/20/2015   Rectal bleeding 05/25/2014   Past Medical History:  Past Medical History:  Diagnosis Date   Allergy    Diabetes mellitus without complication ([place])    Hemorrhoids    Past Surgical History:  Past Surgical History:  Procedure Laterality Date   WISDOM TOOTH EXTRACTION     HPI:  Patient is a 52 y.o. male with PMH: DM, HLD, HTN (not compliant with BP meds recently), embolic stroke 01/3084, h/o subsegmental PE at time of stroke thought to be secondary to Covid-19, Echo in 2013 with normal EF. He presented to the ED on 01/31/22 with brief episode of slurred speech and dizziness and left arm numbness which lasted little less than a minute. In ED he was hypertensive, afebrile. CT head showed lacunar infarct in left basilar ganglia, age-indeterminate, MRI brain showed no acute intracranial abnormality but showed a few small remote lacunar infarcts about the left greater than right basal ganglia.   Assessment / Plan / Recommendation Clinical Impression  Patient was assessed for cognitive-linguistic and speech function. Cognition and language skills were both WNL. Speech was 100% intelligible but sounded mildly slurred and patient observed to speak at a slightly slower rate. No significant oral motor weakness and patient denies any decreased oral motor sensation. He informed SLP that during the events that led him to come to hospital, he was having some speech slurring but that it returned to baseline quickly. He also stated that prior to this  hospitalization, he noticed mild slurring of speech  intermittently when he was working but wasn't sure when this all started. As per MRI taken 06/09/2018 at [facility], patient does have h/o subacute infarct in the left lentiform nucleus and corona radiata but with no acute hemorrhage, mass effect or midline shift. This could explain his c/o of mild speech slurring. SLP not recommending any further skilled intervention at this time. Reviewed Stroke education booklet and encouraged patient to speak with attending MD as well as his PCP about stroke prevention.    SLP Assessment  SLP Recommendation/Assessment: Patient does not need any further Speech [place] [facility] SLP Visit Diagnosis: Dysarthria and anarthria (R47.1)    Recommendations for follow up therapy are one component of a multi-disciplinary discharge planning process, led by the attending physician.  Recommendations may be updated based on patient status, additional functional criteria and insurance authorization.    Follow Up Recommendations  No SLP follow up    Assistance Recommended at Discharge  None  Functional Status Assessment Patient has not had a recent decline in their functional status  Frequency and Duration     N/A      SLP Evaluation Cognition  Overall Cognitive Status: Within Functional Limits for tasks assessed Arousal/Alertness: Awake/alert Orientation Level: Oriented X4       Comprehension  Auditory Comprehension Overall Auditory Comprehension: Appears within functional limits for tasks assessed    Expression Expression Primary Mode of Expression: Verbal Verbal Expression Overall Verbal Expression: Appears within functional limits for tasks assessed   Oral / Motor  Oral Motor/Sensory Function Overall Oral Motor/Sensory Function: Within functional limits Motor Speech Overall Motor  Speech: Other (comment) (patient reports h/o some speech slurring sometimes at work but not sure when this started)           Sonia Baller, MA,  CCC-SLP Speech Therapy

## 2022-02-02 NOTE — Progress Notes (Signed)
Bilateral lower extremity venous duplex has been completed. Preliminary results can be found in CV Proc through chart review.   02/02/22 3:56 PM Andre Castro RVT

## 2022-04-22 ENCOUNTER — Ambulatory Visit: Payer: BC Managed Care – PPO | Admitting: Nurse Practitioner

## 2022-08-17 ENCOUNTER — Ambulatory Visit (HOSPITAL_COMMUNITY)
Admission: EM | Admit: 2022-08-17 | Discharge: 2022-08-17 | Disposition: A | Discharge status: Home / Self Care | Payer: BC Managed Care – PPO | Attending: Family Medicine | Admitting: Family Medicine

## 2022-08-17 ENCOUNTER — Encounter (HOSPITAL_COMMUNITY): Payer: Self-pay | Admitting: *Deleted

## 2022-08-17 DIAGNOSIS — H6123 Impacted cerumen, bilateral: Secondary | ICD-10-CM | POA: Diagnosis not present

## 2022-08-17 DIAGNOSIS — M7522 Bicipital tendinitis, left shoulder: Secondary | ICD-10-CM

## 2022-08-17 HISTORY — DX: Cerebral infarction, unspecified: I63.9

## 2022-08-17 MED ORDER — PREDNISONE 20 MG PO TABS: 40.0000 mg | ORAL_TABLET | Freq: Every day | ORAL | 0 refills | Status: DC

## 2022-08-17 MED ORDER — DICLOFENAC SODIUM 75 MG PO TBEC: 75.0000 mg | DELAYED_RELEASE_TABLET | Freq: Two times a day (BID) | ORAL | 0 refills | Status: DC

## 2022-08-17 NOTE — ED Triage Notes (Signed)
Pt states he has been taking swimming lessons and he ha bilateral ear fullness. He has been using peroxide but no relief.   He also states he has been having left arm pain mostly at nigh when he is rolling over x 1 month. He has been taking tylenol and IBU without relief.

## 2022-08-20 NOTE — ED Provider Notes (Signed)
[facility]   [id number] 08/17/22 Arrival Time: 1103  ASSESSMENT & PLAN:  1. Bilateral impacted cerumen   2. Biceps tendonitis on left    Better after ear flushing. Anti-inflammatories for suspected biceps tendonitis.  Discharge Medication List as of 08/17/2022  2:05 PM     START taking these medications   Details  diclofenac (VOLTAREN) 75 MG EC tablet Take 1 tablet (75 mg total) by mouth 2 (two) times daily., Starting Mon 08/17/2022, Normal    predniSONE (DELTASONE) 20 MG tablet Take 2 tablets (40 mg total) by mouth daily., Starting Mon 08/17/2022, Normal         Follow-up Information     [facility].   Why: If your arm is not getting better over the next week. Contact information: [address] [place] [address] [phone]                Reviewed expectations re: course of current medical issues. Questions answered. Outlined signs and symptoms indicating need for more acute intervention. Understanding verbalized. After Visit Summary given.   SUBJECTIVE: History from: Patient. Andre Castro is a 52 y.o. male. Pt states he has been taking swimming lessons and he ha bilateral ear fullness. He has been using peroxide but no relief.   He also states he has been having left arm pain mostly at nigh when he is rolling over x 1 month. He has been taking tylenol and IBU without relief.  Normal PO intake without n/v/d.  OBJECTIVE:  Vitals:   08/17/22 1239  BP: (!) 156/87  Pulse: 70  Resp: 18  Temp: 98.2 F (36.8 C)  TempSrc: Oral  SpO2: 96%    General appearance: alert; no distress Eyes: PERRLA; EOMI; conjunctiva normal HENT: [place]; AT; with nasal congestion; bilat cerumen impaction Neck: supple  Lungs: speaks full sentences without difficulty; unlabored Extremities: no edema; tender over distal biceps insertion on the left Skin: warm and dry Neurologic: normal gait Psychological: alert  and cooperative; normal mood and affect   No Known Allergies  Past Medical History:  Diagnosis Date   Allergy    Diabetes mellitus without complication (HCC)    Hemorrhoids    Stroke (HCC)    Social History   Socioeconomic History   Marital status: Single    Spouse name: Not on file   Number of children: Not on file   Years of education: 20   Highest education level: Not on file  Occupational History   Occupation: Public Health  Tobacco Use   Smoking status: Never   Smokeless tobacco: Never  Vaping Use   Vaping status: Never Used  Substance and Sexual Activity   Alcohol use: No    Alcohol/week: 0.0 standard drinks of alcohol   Drug use: No   Sexual activity: Never  Other Topics Concern   Not on file  Social History Narrative   Not on file   Social Determinants of Health   Financial Resource Strain: Not on file  Food Insecurity: No Food Insecurity (02/01/2022)   Hunger Vital Sign    Worried About Running Out of Food in the Last Year: Never true    Ran Out of Food in the Last Year: Never true  Transportation Needs: No Transportation Needs (02/01/2022)   PRAPARE - Administrator, Civil Service (Medical): No    Lack of Transportation (Non-Medical): No  Physical Activity: Not on file  Stress: Stress Concern Present (03/22/2020)  Received from [facility]   [employer] of Occupational Health - Occupational Stress Questionnaire    Feeling of Stress : To some extent  Social Connections: Not on file  Intimate Partner Violence: Not At Risk (02/01/2022)   Humiliation, Afraid, Rape, and Kick questionnaire    Fear of Current or Ex-Partner: No    Emotionally Abused: No    Physically Abused: No    Sexually Abused: No   Family History  Problem Relation Age of Onset   Hypertension Mother    Stroke Maternal Grandmother    Cancer Maternal Grandfather    Cancer Paternal Grandmother    Stroke Paternal  Grandmother    Past Surgical History:  Procedure Laterality Date   WISDOM TOOTH EXTRACTION       Mardella Layman, MD 08/20/22 1003

## 2022-12-31 ENCOUNTER — Other Ambulatory Visit: Payer: Self-pay

## 2022-12-31 ENCOUNTER — Emergency Department (HOSPITAL_COMMUNITY)
Admission: EM | Admit: 2022-12-31 | Discharge: 2023-01-01 | Disposition: A | Discharge status: Home / Self Care | Payer: BC Managed Care – PPO | Attending: Emergency Medicine | Admitting: Emergency Medicine

## 2022-12-31 DIAGNOSIS — R42 Dizziness and giddiness: Secondary | ICD-10-CM

## 2022-12-31 DIAGNOSIS — I10 Essential (primary) hypertension: Secondary | ICD-10-CM | POA: Diagnosis not present

## 2022-12-31 DIAGNOSIS — R519 Headache, unspecified: Secondary | ICD-10-CM | POA: Diagnosis present

## 2022-12-31 DIAGNOSIS — Z7982 Long term (current) use of aspirin: Secondary | ICD-10-CM | POA: Diagnosis not present

## 2022-12-31 DIAGNOSIS — Z79899 Other long term (current) drug therapy: Secondary | ICD-10-CM | POA: Insufficient documentation

## 2022-12-31 LAB — CBC
HCT: 43.2 % (ref 39.0–52.0)
Hemoglobin: 14.5 g/dL (ref 13.0–17.0)
MCH: 30.7 pg (ref 26.0–34.0)
MCHC: 33.6 g/dL (ref 30.0–36.0)
MCV: 91.3 fL (ref 80.0–100.0)
Platelets: 217 10*3/uL (ref 150–400)
RBC: 4.73 MIL/uL (ref 4.22–5.81)
RDW: 13.4 % (ref 11.5–15.5)
WBC: 5.4 10*3/uL (ref 4.0–10.5)
nRBC: 0 % (ref 0.0–0.2)

## 2022-12-31 LAB — BASIC METABOLIC PANEL
Anion gap: 7 (ref 5–15)
BUN: 12 mg/dL (ref 6–20)
CO2: 24 mmol/L (ref 22–32)
Calcium: 9.1 mg/dL (ref 8.9–10.3)
Chloride: 104 mmol/L (ref 98–111)
Creatinine, Ser: 1.12 mg/dL (ref 0.61–1.24)
GFR, Estimated: >60 mL/min (ref >60)
Glucose, Bld: 113 mg/dL — ABNORMAL HIGH (ref 70–99)
Potassium: 3.5 mmol/L (ref 3.5–5.1)
Sodium: 135 mmol/L (ref 135–145)

## 2022-12-31 LAB — CBG MONITORING, ED: Glucose-Capillary: 108 mg/dL — ABNORMAL HIGH (ref 70–99)

## 2022-12-31 NOTE — ED Triage Notes (Addendum)
Pt arrives with reports of feeling "wobbly" and weak in legs since the 24th. Pt reports mild headache as well. Pt reports feeling like his speech may be slurred, pt with clear speech in triage. Pt face symmetrical and no focal weakness noted. Strong grip on both sides and no numbness or tingling noted. Pt with steady gait to triage

## 2023-01-01 ENCOUNTER — Emergency Department (HOSPITAL_COMMUNITY): Payer: BC Managed Care – PPO

## 2023-01-01 MED ORDER — AMLODIPINE BESYLATE 5 MG PO TABS: 5.0000 mg | ORAL_TABLET | Freq: Every day | ORAL | 1 refills | Status: DC

## 2023-01-01 NOTE — ED Provider Notes (Signed)
[place] EMERGENCY DEPARTMENT AT [facility] Provider Note   CSN: [id number] Arrival date & time: 12/31/22  2013     History  Chief Complaint  Patient presents with   Headache   Weakness    Andre Castro is a 52 y.o. male.  The history is provided by the patient and medical records.  Headache Associated symptoms: weakness   Weakness Associated symptoms: headaches   Andre Castro is a 52 y.o. male who presents to the Emergency Department complaining of weakness and headache.  He presents to the emergency department for evaluation of symptoms that have since resolved.  He states a week ago he was having speech difficulties, which have now resolved.  He also reports that on 1224 he had a few episodes where he was having trouble walking and he was falling over.  He currently can speak and walk at baseline.  He does have a history of TIA, hypertension, PE not currently on anticoagulation but he does take a daily aspirin.  No associated fever.  He did have a headache earlier in the week that he describes as mild and it has since resolved.  No nausea, vomiting.   No tobacco, alcohol, drugs.     Home Medications Prior to Admission medications   Medication Sig Start Date End Date Taking? Authorizing Provider  amLODipine (NORVASC) 5 MG tablet Take 1 tablet (5 mg total) by mouth daily. 01/01/23  Yes Tilden Fossa, MD  aspirin EC 81 MG tablet Take 1 tablet (81 mg total) by mouth daily. Swallow whole. 02/03/22   Lanae Boast, MD  diclofenac (VOLTAREN) 75 MG EC tablet Take 1 tablet (75 mg total) by mouth 2 (two) times daily. 08/17/22   Mardella Layman, MD  predniSONE (DELTASONE) 20 MG tablet Take 2 tablets (40 mg total) by mouth daily. 08/17/22   Mardella Layman, MD      Allergies    Patient has no known allergies.    Review of Systems   Review of Systems  Neurological:  Positive for weakness and headaches.  All other systems reviewed and are negative.   Physical  Exam Updated Vital Signs BP (!) 176/112 (BP Location: Left Arm)   Pulse 72   Temp 98.1 F (36.7 C) (Oral)   Resp 18   Ht 5\' 11"  (1.803 m)   Wt 90.7 kg   SpO2 100%   BMI 27.89 kg/m  Physical Exam Vitals and nursing note reviewed.  Constitutional:      Appearance: He is well-developed.  HENT:     Head: Normocephalic and atraumatic.  Cardiovascular:     Rate and Rhythm: Normal rate and regular rhythm.     Heart sounds: No murmur heard. Pulmonary:     Effort: Pulmonary effort is normal. No respiratory distress.     Breath sounds: Normal breath sounds.  Abdominal:     Palpations: Abdomen is soft.     Tenderness: There is no abdominal tenderness. There is no guarding or rebound.  Musculoskeletal:        General: No swelling or tenderness.  Skin:    General: Skin is warm and dry.     Capillary Refill: Capillary refill takes less than 2 seconds.  Neurological:     Mental Status: He is alert and oriented to person, place, and time.     Comments: No asymmetry of facial movements, visual fields grossly intact. 5/5 strength in all four extremities with sensation to light touch intact in all four extremities.  Psychiatric:        Behavior: Behavior normal.     ED Results / Procedures / Treatments   Labs (all labs ordered are listed, but only abnormal results are displayed) Labs Reviewed  BASIC METABOLIC PANEL - Abnormal; Notable for the following components:      Result Value   Glucose, Bld 113 (*)    All other components within normal limits  CBG MONITORING, ED - Abnormal; Notable for the following components:   Glucose-Capillary 108 (*)    All other components within normal limits  CBC    EKG EKG Interpretation Date/Time:  Thursday December 31 2022 20:49:15 EST Ventricular Rate:  83 PR Interval:  154 QRS Duration:  95 QT Interval:  332 QTC Calculation: 390 R Axis:   42  Text Interpretation: Sinus rhythm Probable left atrial enlargement Left ventricular  hypertrophy Borderline T abnormalities, lateral leads ST elev, probable normal early repol pattern Confirmed by Tilden Fossa [phone]) on 01/01/2023 3:21:38 AM  Radiology MR BRAIN WO CONTRAST Result Date: 01/01/2023 CLINICAL DATA:  52 year old male with abnormal speech, dizziness, symptoms for 24 hours. EXAM: MRI HEAD WITHOUT CONTRAST TECHNIQUE: Multiplanar, multiecho pulse sequences of the brain and surrounding structures were obtained without intravenous contrast. COMPARISON:  CTA head and neck 02/01/2022.  Brain MRI 01/31/2022. FINDINGS: Brain: No restricted diffusion to suggest acute infarction. No midline shift, mass effect, evidence of mass lesion, ventriculomegaly, extra-axial collection or acute intracranial hemorrhage. Cervicomedullary junction and pituitary are within normal limits. Chronic lacunar infarcts in the posterior left corona radiata, left lentiform, left greater than right internal capsules with T2 and FLAIR hyperintensity appear unchanged since January. Background brain volume remains normal for age. Other gray and white matter signal within normal limits for age. No chronic cerebral blood products. Brainstem and cerebellum remain negative. Vascular: Major intracranial vascular flow voids are stable since January. Skull and upper cervical spine: Normal visible cervical spine, bone marrow signal. Sinuses/Orbits: Stable, negative. Other: Mastoids are clear. Visible internal auditory structures appear normal. Negative visible scalp and face. IMPRESSION: 1. Stable since January, no acute intracranial abnormality. 2. Chronic lacunar infarcts in the left greater than right deep white matter capsules, left basal ganglia. Electronically Signed   By: Odessa Fleming M.D.   On: 01/01/2023 04:36    Procedures Procedures    Medications Ordered in ED Medications - No data to display  ED Course/ Medical Decision Making/ A&P                                 Medical Decision Making Amount and/or  Complexity of Data Reviewed Labs: ordered. Radiology: ordered.  Risk Prescription drug management.   Patient with history of hypertension, CVA, remote PE not on anticoagulation here for evaluation of transient headache that is now resolved as well as transient speech changes that he thinks may have been related to dental issues at the time as well as 2 brief episodes of difficulty walking a couple days ago.  He is nontoxic-appearing on evaluation with no focal neurologic deficits.  He is hypertensive but takes his blood pressure medications as needed and only when he feels unwell.  MRI demonstrates chronic changes.  Renal function is within normal limits.  Presentation is not consistent with acute CVA, subarachnoid hemorrhage, hypertensive urgency, meningitis, TIA.  Discussed with patient unclear source of his prior episodes.  Will prescribe medication for him to take daily for his high blood pressure.  Discussed PCP follow-up as well as return precautions.        Final Clinical Impression(s) / ED Diagnoses Final diagnoses:  Bad headache  Dizziness  Essential hypertension    Rx / DC Orders ED Discharge Orders          Ordered    amLODipine (NORVASC) 5 MG tablet  Daily        01/01/23 0506              Tilden Fossa, MD 01/01/23 [phone]

## 2023-02-04 ENCOUNTER — Ambulatory Visit: Payer: BC Managed Care – PPO | Admitting: Family Medicine

## 2023-02-04 VITALS — BP 164/86 | HR 89 | Temp 97.5°F | Ht 70.0 in | Wt 204.6 lb

## 2023-02-04 DIAGNOSIS — T466X5A Adverse effect of antihyperlipidemic and antiarteriosclerotic drugs, initial encounter: Secondary | ICD-10-CM | POA: Insufficient documentation

## 2023-02-04 DIAGNOSIS — R0789 Other chest pain: Secondary | ICD-10-CM

## 2023-02-04 DIAGNOSIS — M791 Myalgia, unspecified site: Secondary | ICD-10-CM | POA: Diagnosis not present

## 2023-02-04 DIAGNOSIS — I69911 Memory deficit following unspecified cerebrovascular disease: Secondary | ICD-10-CM | POA: Insufficient documentation

## 2023-02-04 DIAGNOSIS — Z8673 Personal history of transient ischemic attack (TIA), and cerebral infarction without residual deficits: Secondary | ICD-10-CM

## 2023-02-04 DIAGNOSIS — E782 Mixed hyperlipidemia: Secondary | ICD-10-CM | POA: Diagnosis not present

## 2023-02-04 DIAGNOSIS — I10 Essential (primary) hypertension: Secondary | ICD-10-CM | POA: Diagnosis not present

## 2023-02-04 DIAGNOSIS — K429 Umbilical hernia without obstruction or gangrene: Secondary | ICD-10-CM

## 2023-02-04 DIAGNOSIS — Q2112 Patent foramen ovale: Secondary | ICD-10-CM | POA: Insufficient documentation

## 2023-02-04 DIAGNOSIS — Z86711 Personal history of pulmonary embolism: Secondary | ICD-10-CM

## 2023-02-04 MED ORDER — AMLODIPINE BESYLATE 10 MG PO TABS: 10.0000 mg | ORAL_TABLET | Freq: Every day | ORAL | 0 refills | Status: DC

## 2023-02-04 NOTE — Patient Instructions (Signed)
VISIT SUMMARY:  Today, we reviewed your chronic conditions, including hypertension, diabetes, history of stroke, and other health concerns. We discussed adjustments to your medications and lifestyle to better manage your health.  YOUR PLAN:  -HYPERTENSION: Hypertension, or high blood pressure, means your blood pressure is consistently too high, which can lead to serious health problems. We have increased your amlodipine dose to 10 mg daily. Please check your blood pressure at home 1-2 times daily and follow up in one month to recheck your blood pressure.  -DIABETES MELLITUS: Diabetes is a condition where your blood sugar levels are too high. We will order fasting lab work, including an A1c test, to check your blood sugar levels. It's important to manage your blood sugar through a low-carb diet, reducing candy and processed foods, and increasing physical activity like walking 30 minutes most days and weightlifting as you are able.  Berdie Ogren INFARCT: A lacunar infarct is a type of stroke caused by a blockage in the small arteries of the brain. You have a history of this and have experienced recent forgetfulness and chest discomfort. We will refer you to neurology and cardiology for follow-up and evaluation, and obtain records from your recent ER visit.  -HYPERLIPIDEMIA: Hyperlipidemia means you have high levels of fats (lipids) in your blood, which increases your risk of heart disease. We will order a fasting lipid panel and discuss alternative cholesterol-lowering medications if you cannot tolerate statins.  -PULMONARY EMBOLISM: A pulmonary embolism is a blood clot in the lungs. You had this condition previously, and it is important to continue taking daily aspirin to reduce the risk of further clots.  -UMBILICAL HERNIA: An umbilical hernia is when part of the intestine protrudes through the abdominal muscles near the belly button. You have a possible umbilical hernia that is easily reducible and not  causing pain. Monitor for severe abdominal pain, nausea, or vomiting, and we will refer you to general surgery if symptoms worsen.  -GENERAL HEALTH MAINTENANCE: To manage your cardiovascular risk factors, we recommend lifestyle modifications including a low-carb diet, reduced salt intake, increased physical activity, and a high fiber diet.  INSTRUCTIONS:  Please follow up in one month for a physical and blood pressure recheck. Ensure you are fasting for lab work on the day of your follow-up. A printout of today's visit summary will be provided.

## 2023-02-04 NOTE — Progress Notes (Addendum)
Assessment/Plan:   Assessment and Plan    Hypertension Poorly managed hypertension with current BP 166/98 mmHg. Typically takes amlodipine after work. Home readings >140/90 mmHg. Discussed increasing amlodipine to 10 mg daily. Explained amlodipine does not affect potassium levels or cause increased urination. - Increase amlodipine to 10 mg daily - Check blood pressure at home 1-2 times daily - Follow-up in one month to recheck blood pressure  Diabetes Mellitus Diabetes diagnosed during a stroke in 2020. A1c previously decreased to 5 but likely elevated due to increased candy consumption. No current diabetes medications. Discussed importance of managing blood glucose through diet and exercise to prevent complications. - Order fasting lab work including A1c - Encourage low-carb diet, less candy, processed foods, and sodas - Increase physical activity, including walking 30 minutes most days and weightlifting as able  Lacunar Infarct Left lentiform nucleus lacunar infarct in 2020. No current dizziness or headaches but recent forgetfulness and intermittent left-sided chest discomfort. Recent ER visit in December showed normal sinus rhythm on EKG and no new infarct on imaging. Discussed importance of follow-up with neurology and cardiology to monitor for potential complications and manage risk factors. - Refer to neurology for follow-up - Refer to cardiology for evaluation and potential stress test - Obtain records from recent ER visit for review  Hyperlipidemia Hyperlipidemia with previous statin-induced myalgia. Statins are the best option for reducing cardiovascular risk but concerned about side effects. Discussed alternative cholesterol-lowering medications if statins are not tolerated. - Order fasting lipid panel - Discuss alternative cholesterol-lowering medications if statins are not tolerated  Pulmonary Embolism Pulmonary embolism in the distal segment of the right lower lobe with  bronchial thickening, suspected to be related to COVID-19. Discussed importance of continuing daily aspirin to reduce the risk of further clots. - Continue daily aspirin  Umbilical Hernia Possible umbilical hernia with change in appearance of belly button, easily reducible, no current abdominal tenderness. Discussed monitoring for severe abdominal pain, nausea, vomiting, and potential need for surgical intervention if symptoms worsen. - Monitor for severe abdominal pain, nausea, vomiting - Refer to general surgery if symptoms worsen  General Health Maintenance 53 year old African American with multiple cardiovascular risk factors including hypertension, diabetes, and hyperlipidemia. Discussed lifestyle modifications to manage these conditions. - Encourage lifestyle modifications including a low-carb diet, reduced salt intake, increased physical activity, and high fiber diet  Follow-up - Follow-up in one month for physical and blood pressure recheck - Ensure fasting for lab work on the day of follow-up - Provide printout of today's visit summary.        Medications Discontinued During This Encounter  Medication Reason  . predniSONE (DELTASONE) 20 MG tablet   . diclofenac (VOLTAREN) 75 MG EC tablet   . amLODipine (NORVASC) 5 MG tablet Reorder    Return in about 1 month (around 03/05/2023) for BP, physical (fasting labs).    Subjective:   Encounter date: 02/04/2023  Andre Castro is a 53 y.o. male who has Rectal bleeding; Diabetes mellitus type 2, uncomplicated (HCC); History of colonic polyps; TIA (transient ischemic attack); Umbilical hernia without obstruction and without gangrene; PFO (patent foramen ovale); History of pulmonary embolism; History of stroke; Myalgia due to statin; Intermittent left-sided chest pain; Memory deficit 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; and Primary hypertension on their problem list..   He  has a past medical history of Allergy,  Diabetes mellitus without complication (HCC), Hemorrhoids, Hypertension, and Stroke (HCC).Marland Kitchen   He presents with chief complaint of Establish  Care (Patient would like to discuss colonoscopy options ) .   Discussed the use of AI scribe software for clinical note transcription with the patient, who gave verbal consent to proceed.  History of Present Illness   The patient is a 53 year old male with hypertension, diabetes, and a history of stroke who presents for management of chronic conditions.  He has a history of hypertension, currently managed with amlodipine 5 mg, which he did not take today. His home blood pressure readings are usually above 140/90 mmHg, and today's reading is 166/98 mmHg.  He was diagnosed with diabetes in 2020 following a stroke. He is not on diabetes medication as his A1c had previously decreased to normal levels, but he suspects it may be elevated now due to increased candy consumption.  He experienced a left lentiform nucleus lacunar infarct in 2020. He reports recent forgetfulness, attributing it to stress and workload. He also has a history of a small cardiac defect identified as a potential cause for the stroke and had seven blood clots in his right lung at the time of the stroke, though the source of the stroke-causing clot was uncertain.  He experiences intermittent left-sided chest discomfort described as a 'weird' feeling. In December 2024, he had an episode of dizziness and went to the emergency department, where an EKG showed normal sinus rhythm with some elevation in leads V2 and V3. He has not had dizziness or headaches recently, although he did have a headache earlier this week.  He notes a change in his belly button, suggesting a possible hernia, but denies any abdominal tenderness or discomfort.  He has a history of a thrombosed hemorrhoid and bicep tendonitis, previously treated with medications. He is no longer taking diclofenac for pain management.        Review of Systems  Endo/Heme/Allergies:  Negative for polydipsia.  All other systems reviewed and are negative.   Past Surgical History:  Procedure Laterality Date  . WISDOM TOOTH EXTRACTION      Outpatient Medications Prior to Visit  Medication Sig Dispense Refill  . aspirin EC 81 MG tablet Take 1 tablet (81 mg total) by mouth daily. Swallow whole. 30 tablet 12  . MAGNESIUM GLYCINATE PO Take by mouth.    Marland Kitchen amLODipine (NORVASC) 5 MG tablet Take 1 tablet (5 mg total) by mouth daily. 30 tablet 1  . diclofenac (VOLTAREN) 75 MG EC tablet Take 1 tablet (75 mg total) by mouth 2 (two) times daily. 14 tablet 0  . predniSONE (DELTASONE) 20 MG tablet Take 2 tablets (40 mg total) by mouth daily. (Patient not taking: Reported on 02/04/2023) 10 tablet 0   No facility-administered medications prior to visit.    Family History  Problem Relation Age of Onset  . Hypertension Mother   . Stroke Maternal Grandmother   . Cancer Maternal Grandfather   . Cancer Paternal Grandmother   . Stroke Paternal Grandmother     Social History   Socioeconomic History  . Marital status: Single    Spouse name: Not on file  . Number of children: Not on file  . Years of education: 68  . Highest education level: Master's degree (e.g., MA, MS, MEng, MEd, MSW, MBA)  Occupational History  . Occupation: Nurse, mental health  Tobacco Use  . Smoking status: Never  . Smokeless tobacco: Never  Vaping Use  . Vaping status: Never Used  Substance and Sexual Activity  . Alcohol use: No  . Drug use: No  . Sexual  activity: Not Currently  Other Topics Concern  . Not on file  Social History Narrative  . Not on file   Social Drivers of Health   Financial Resource Strain: Not on file  Food Insecurity: Food Insecurity Present (02/04/2023)   Hunger Vital Sign   . Worried About Programme researcher, broadcasting/film/video in the Last Year: Sometimes true   . Ran Out of Food in the Last Year: Sometimes true  Transportation Needs: No  Transportation Needs (02/04/2023)   PRAPARE - Transportation   . Lack of Transportation (Medical): No   . Lack of Transportation (Non-Medical): No  Physical Activity: Sufficiently Active (02/04/2023)   Exercise Vital Sign   . Days of Exercise per Week: 5 days   . Minutes of Exercise per Session: 150+ min  Stress: Stress Concern Present (02/04/2023)   [employer] of Occupational Health - Occupational Stress Questionnaire   . Feeling of Stress : To some extent  Social Connections: Moderately Integrated (02/04/2023)   Social Connection and Isolation Panel [NHANES]   . Frequency of Communication with Friends and Family: Three times a week   . Frequency of Social Gatherings with Friends and Family: Once a week   . Attends Religious Services: 1 to 4 times per year   . Active Member of Clubs or Organizations: Yes   . Attends Banker Meetings: 1 to 4 times per year   . Marital Status: Never married  Intimate Partner Violence: Not At Risk (02/01/2022)   Humiliation, Afraid, Rape, and Kick questionnaire   . Fear of Current or Ex-Partner: No   . Emotionally Abused: No   . Physically Abused: No   . Sexually Abused: No                                                                                                  Objective:  Physical Exam: BP (!) 166/98   Pulse 89   Temp (!) 97.5 F (36.4 C) (Temporal)   Ht 5\' 10"  (1.778 m)   Wt 204 lb 9.6 oz (92.8 kg)   SpO2 97%   BMI 29.36 kg/m     Physical Exam Constitutional:      Appearance: Normal appearance.  HENT:     Head: Normocephalic and atraumatic.     Right Ear: Hearing normal.     Left Ear: Hearing normal.     Nose: Nose normal.  Eyes:     General: No scleral icterus.       Right eye: No discharge.        Left eye: No discharge.     Extraocular Movements: Extraocular movements intact.  Cardiovascular:     Rate and Rhythm: Normal rate and regular rhythm.     Heart sounds: Normal heart sounds.  Pulmonary:      Effort: Pulmonary effort is normal.     Breath sounds: Normal breath sounds.  Abdominal:     Palpations: Abdomen is soft.     Tenderness: There is no abdominal tenderness.     Hernia: A hernia is present. Hernia is present in the umbilical area (  Easily reducible).  Musculoskeletal:     Right lower leg: No edema.     Left lower leg: No edema.  Skin:    General: Skin is warm.     Findings: No rash.  Neurological:     General: No focal deficit present.     Mental Status: He is alert.     Cranial Nerves: No cranial nerve deficit.  Psychiatric:        Mood and Affect: Affect is not inappropriate.        Speech: Speech normal.        Behavior: Behavior is cooperative.        Thought Content: Thought content does not include homicidal or suicidal ideation.        Judgment: Judgment is not impulsive.    MR BRAIN WO CONTRAST Result Date: 01/01/2023 CLINICAL DATA:  53 year old male with abnormal speech, dizziness, symptoms for 24 hours. EXAM: MRI HEAD WITHOUT CONTRAST TECHNIQUE: Multiplanar, multiecho pulse sequences of the brain and surrounding structures were obtained without intravenous contrast. COMPARISON:  CTA head and neck 02/01/2022.  Brain MRI 01/31/2022. FINDINGS: Brain: No restricted diffusion to suggest acute infarction. No midline shift, mass effect, evidence of mass lesion, ventriculomegaly, extra-axial collection or acute intracranial hemorrhage. Cervicomedullary junction and pituitary are within normal limits. Chronic lacunar infarcts in the posterior left corona radiata, left lentiform, left greater than right internal capsules with T2 and FLAIR hyperintensity appear unchanged since January. Background brain volume remains normal for age. Other gray and white matter signal within normal limits for age. No chronic cerebral blood products. Brainstem and cerebellum remain negative. Vascular: Major intracranial vascular flow voids are stable since January. Skull and upper cervical spine:  Normal visible cervical spine, bone marrow signal. Sinuses/Orbits: Stable, negative. Other: Mastoids are clear. Visible internal auditory structures appear normal. Negative visible scalp and face. IMPRESSION: 1. Stable since January, no acute intracranial abnormality. 2. Chronic lacunar infarcts in the left greater than right deep white matter capsules, left basal ganglia. Electronically Signed   By: Odessa Fleming M.D.   On: 01/01/2023 04:36    Recent Results (from the past 2160 hours)  CBG monitoring, ED     Status: Abnormal   Collection Time: 12/31/22  9:05 PM  Result Value Ref Range   Glucose-Capillary 108 (H) 70 - 99 mg/dL    Comment: Glucose reference range applies only to samples taken after fasting for at least 8 hours.  Basic metabolic panel     Status: Abnormal   Collection Time: 12/31/22  9:11 PM  Result Value Ref Range   Sodium 135 135 - 145 mmol/L   Potassium 3.5 3.5 - 5.1 mmol/L   Chloride 104 98 - 111 mmol/L   CO2 24 22 - 32 mmol/L   Glucose, Bld 113 (H) 70 - 99 mg/dL    Comment: Glucose reference range applies only to samples taken after fasting for at least 8 hours.   BUN 12 6 - 20 mg/dL   Creatinine, Ser 0.86 0.61 - 1.24 mg/dL   Calcium 9.1 8.9 - 57.8 mg/dL   GFR, Estimated >46 >96 mL/min    Comment: (NOTE) Calculated using the CKD-EPI Creatinine Equation (2021)    Anion gap 7 5 - 15    Comment: Performed at [facility], [address], [place], [place] [address]  CBC     Status: None   Collection Time: 12/31/22  9:11 PM  Result Value Ref Range   WBC 5.4 4.0 -  10.5 K/uL   RBC 4.73 4.22 - 5.81 MIL/uL   Hemoglobin 14.5 13.0 - 17.0 g/dL   HCT 16.1 09.6 - 04.5 %   MCV 91.3 80.0 - 100.0 fL   MCH 30.7 26.0 - 34.0 pg   MCHC 33.6 30.0 - 36.0 g/dL   RDW 40.9 81.1 - 91.4 %   Platelets 217 150 - 400 K/uL   nRBC 0.0 0.0 - 0.2 %    Comment: Performed at [facility], [address], [place], [place] [address]        Garner Nash, MD, MS

## 2023-02-26 ENCOUNTER — Other Ambulatory Visit: Payer: Self-pay | Admitting: Family Medicine

## 2023-02-26 DIAGNOSIS — I69911 Memory deficit following unspecified cerebrovascular disease: Secondary | ICD-10-CM

## 2023-03-08 ENCOUNTER — Other Ambulatory Visit: Payer: Self-pay | Admitting: Family Medicine

## 2023-03-08 ENCOUNTER — Encounter: Payer: Self-pay | Admitting: Family Medicine

## 2023-03-08 ENCOUNTER — Ambulatory Visit (INDEPENDENT_AMBULATORY_CARE_PROVIDER_SITE_OTHER): Payer: BC Managed Care – PPO | Admitting: Family Medicine

## 2023-03-08 VITALS — BP 136/82 | HR 75 | Temp 97.9°F | Ht 70.0 in | Wt 209.4 lb

## 2023-03-08 DIAGNOSIS — Z6830 Body mass index (BMI) 30.0-30.9, adult: Secondary | ICD-10-CM

## 2023-03-08 DIAGNOSIS — Z8673 Personal history of transient ischemic attack (TIA), and cerebral infarction without residual deficits: Secondary | ICD-10-CM

## 2023-03-08 DIAGNOSIS — E785 Hyperlipidemia, unspecified: Secondary | ICD-10-CM | POA: Diagnosis not present

## 2023-03-08 DIAGNOSIS — I2584 Coronary atherosclerosis due to calcified coronary lesion: Secondary | ICD-10-CM

## 2023-03-08 DIAGNOSIS — E66811 Obesity, class 1: Secondary | ICD-10-CM | POA: Diagnosis not present

## 2023-03-08 DIAGNOSIS — E1165 Type 2 diabetes mellitus with hyperglycemia: Secondary | ICD-10-CM | POA: Diagnosis not present

## 2023-03-08 DIAGNOSIS — Z0000 Encounter for general adult medical examination without abnormal findings: Secondary | ICD-10-CM

## 2023-03-08 DIAGNOSIS — K429 Umbilical hernia without obstruction or gangrene: Secondary | ICD-10-CM

## 2023-03-08 DIAGNOSIS — Z125 Encounter for screening for malignant neoplasm of prostate: Secondary | ICD-10-CM

## 2023-03-08 DIAGNOSIS — E6609 Other obesity due to excess calories: Secondary | ICD-10-CM | POA: Insufficient documentation

## 2023-03-08 DIAGNOSIS — K640 First degree hemorrhoids: Secondary | ICD-10-CM | POA: Insufficient documentation

## 2023-03-08 DIAGNOSIS — I10 Essential (primary) hypertension: Secondary | ICD-10-CM | POA: Diagnosis not present

## 2023-03-08 DIAGNOSIS — E1169 Type 2 diabetes mellitus with other specified complication: Secondary | ICD-10-CM

## 2023-03-08 DIAGNOSIS — I2510 Atherosclerotic heart disease of native coronary artery without angina pectoris: Secondary | ICD-10-CM

## 2023-03-08 DIAGNOSIS — Z1211 Encounter for screening for malignant neoplasm of colon: Secondary | ICD-10-CM

## 2023-03-08 LAB — COMPREHENSIVE METABOLIC PANEL
ALT: 22 U/L (ref 0–53)
AST: 22 U/L (ref 0–37)
Albumin: 4.3 g/dL (ref 3.5–5.2)
Alkaline Phosphatase: 73 U/L (ref 39–117)
BUN: 12 mg/dL (ref 6–23)
CO2: 28 meq/L (ref 19–32)
Calcium: 9.3 mg/dL (ref 8.4–10.5)
Chloride: 105 meq/L (ref 96–112)
Creatinine, Ser: 1.23 mg/dL (ref 0.40–1.50)
GFR: 67.03 mL/min (ref >60.00)
Glucose, Bld: 142 mg/dL — ABNORMAL HIGH (ref 70–99)
Potassium: 4 meq/L (ref 3.5–5.1)
Sodium: 140 meq/L (ref 135–145)
Total Bilirubin: 0.5 mg/dL (ref 0.2–1.2)
Total Protein: 7.1 g/dL (ref 6.0–8.3)

## 2023-03-08 LAB — MICROALBUMIN / CREATININE URINE RATIO
Creatinine,U: 96.3 mg/dL
Microalb Creat Ratio: 19.3 mg/g (ref 0.0–30.0)
Microalb, Ur: 1.9 mg/dL (ref 0.0–1.9)

## 2023-03-08 LAB — URINALYSIS, ROUTINE W REFLEX MICROSCOPIC
Bilirubin Urine: NEGATIVE
Hgb urine dipstick: NEGATIVE
Ketones, ur: NEGATIVE
Leukocytes,Ua: NEGATIVE
Nitrite: NEGATIVE
RBC / HPF: NONE SEEN (ref 0–?)
Specific Gravity, Urine: 1.01 (ref 1.000–1.030)
Total Protein, Urine: NEGATIVE
Urine Glucose: NEGATIVE
Urobilinogen, UA: 0.2 (ref 0.0–1.0)
WBC, UA: NONE SEEN (ref 0–?)
pH: 6.5 (ref 5.0–8.0)

## 2023-03-08 LAB — CBC WITH DIFFERENTIAL/PLATELET
Basophils Absolute: 0 10*3/uL (ref 0.0–0.1)
Basophils Relative: 0.4 % (ref 0.0–3.0)
Eosinophils Absolute: 0.1 10*3/uL (ref 0.0–0.7)
Eosinophils Relative: 1.6 % (ref 0.0–5.0)
HCT: 41.3 % (ref 39.0–52.0)
Hemoglobin: 13.9 g/dL (ref 13.0–17.0)
Lymphocytes Relative: 44.4 % (ref 12.0–46.0)
Lymphs Abs: 2.1 10*3/uL (ref 0.7–4.0)
MCHC: 33.7 g/dL (ref 30.0–36.0)
MCV: 91.6 fl (ref 78.0–100.0)
Monocytes Absolute: 0.6 10*3/uL (ref 0.1–1.0)
Monocytes Relative: 13.7 % — ABNORMAL HIGH (ref 3.0–12.0)
Neutro Abs: 1.9 10*3/uL (ref 1.4–7.7)
Neutrophils Relative %: 39.9 % — ABNORMAL LOW (ref 43.0–77.0)
Platelets: 200 10*3/uL (ref 150.0–400.0)
RBC: 4.51 Mil/uL (ref 4.22–5.81)
RDW: 14.1 % (ref 11.5–15.5)
WBC: 4.8 10*3/uL (ref 4.0–10.5)

## 2023-03-08 LAB — LIPID PANEL
Cholesterol: 226 mg/dL — ABNORMAL HIGH (ref 0–200)
HDL: 49 mg/dL (ref >39.00)
LDL Cholesterol: 161 mg/dL — ABNORMAL HIGH (ref 0–99)
NonHDL: 177.28
Total CHOL/HDL Ratio: 5
Triglycerides: 81 mg/dL (ref 0.0–149.0)
VLDL: 16.2 mg/dL (ref 0.0–40.0)

## 2023-03-08 LAB — POCT GLYCOSYLATED HEMOGLOBIN (HGB A1C): Hemoglobin A1C: 7 % — AB (ref 4.0–5.6)

## 2023-03-08 LAB — PSA: PSA: 0.36 ng/mL (ref 0.10–4.00)

## 2023-03-08 LAB — TSH: TSH: 1.15 u[IU]/mL (ref 0.35–5.50)

## 2023-03-08 MED ORDER — AMLODIPINE BESYLATE 10 MG PO TABS: 10.0000 mg | ORAL_TABLET | Freq: Every day | ORAL | 3 refills | Status: AC

## 2023-03-08 MED ORDER — INDAPAMIDE 1.25 MG PO TABS: 1.2500 mg | ORAL_TABLET | Freq: Every day | ORAL | 3 refills | Status: DC

## 2023-03-08 MED ORDER — EZETIMIBE 10 MG PO TABS: 10.0000 mg | ORAL_TABLET | Freq: Every day | ORAL | 3 refills | Status: DC

## 2023-03-08 NOTE — Progress Notes (Signed)
 Assessment  Assessment/Plan:   Hypertension Hypertension, previously 164/86, now 136/82 on amlodipine 10 mg daily. Not at goal (<130/80) for a patient with diabetes and cardiovascular history. Discussed adding indapamide to further lower BP, monitoring kidney function and electrolytes for adverse effects. - Prescribe indapamide 1.25 mg daily - Continue amlodipine 10 mg daily - Order repeat metabolic panel in 2 weeks - Follow-up in 3 months to recheck blood pressure  Type 2 Diabetes Mellitus Type 2 Diabetes Mellitus with A1c of 7.0. Goal to reduce A1c below 7.0 due to cardiovascular history. Patient plans to resume exercise and improve diet. Discussed referral to a nutritionist, low-carb diet, increased fruits and vegetables, healthy fats, and 150 minutes of cardiovascular exercise per week. - Refer to nutritionist for dietary management - Recommend low-carb diet, increased fruits and vegetables, and healthy fats - Encourage 150 minutes of cardiovascular exercise per week - Monitor blood glucose levels regularly - Follow-up in 3 months to recheck A1c  Hemorrhoids Recurrent hemorrhoids. No history of colon polyps. Discussed referral to a specialist and need for colonoscopy for cancer screening. - Refer to specialist for hemorrhoid management - Schedule colonoscopy for cancer screening  General Health Maintenance None - Schedule diabetic eye exam - Order PSA screening - Check thyroid function, blood counts, liver function, and electrolytes - Encourage regular dental visits - Recommend regular foot care and monitoring  Follow-up - Follow-up in 2 weeks for repeat metabolic panel - Follow-up in 3 months to recheck blood pressure and A1c         There are no discontinued medications.  Patient Counseling(The following topics were reviewed and/or handout was given):  -Nutrition: Stressed importance of moderation in sodium/caffeine intake, saturated fat and cholesterol, caloric  balance, sufficient intake of fresh fruits, vegetables, and fiber.  -Stressed the importance of regular exercise.   -Substance Abuse: Discussed cessation/primary prevention of tobacco, alcohol, or other drug use; driving or other dangerous activities under the influence; availability of treatment for abuse.   -Injury prevention: Discussed safety belts, safety helmets, smoke detector, smoking near bedding or upholstery.   -Sexuality: Discussed sexually transmitted diseases, partner selection, use of condoms, avoidance of unintended pregnancy and contraceptive alternatives.   -Dental health: Discussed importance of regular tooth brushing, flossing, and dental visits.  -Health maintenance and immunizations reviewed. Please refer to Health maintenance section.     Subjective:  Chief complaint Encounter date: 03/08/2023  Chief Complaint  Patient presents with   Annual Exam    Fasting. Will schedule eye exam this week . Patient would like to discuss colonoscopy options    Discussed the use of AI scribe software for clinical note transcription with the patient, who gave verbal consent to proceed.  History of Present Illness   Andre Castro is a 53 year old male with hypertension and diabetes who presents for follow-up on blood pressure and annual exam.  His blood pressure was previously elevated at 164/86 mmHg, and he was started on amlodipine 10 mg daily. Today, his blood pressure has improved to 136/82 mmHg. He feels sleepy, which he attributes to either the medication or his irregular sleep schedule. 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  He has a history of diabetes with a current A1c of 7.0. He has previously been on medication for diabetes but stopped due to feeling 'weird.' He managed to lower his A1c to the 5-6  range through lifestyle changes. He plans to resume exercising  and running. He acknowledges not eating enough vegetables and often consumes fast food due to his busy schedule. He works out at [employer], lifting heavy items for one to three hours, but does not engage in cardiovascular exercise. He reports increased thirst but attributes it to inadequate water intake before work.  He has a history of heart disease and stroke. No chest pain, shortness of breath, or headaches. He has not been checking his blood pressure at home since his last visit.  He has a history of hemorrhoids, which had improved but have recently returned. He has not had a colonoscopy but recalls a Cologuard test about eight years ago. He denies a history of colon polyps.  He wears glasses and plans to schedule an eye exam. He has seen a dentist within the past six months and is aware of the increased risk of dental disease due to diabetes. He reports some calluses from work and a thickened nail on his right foot but denies any ulcers or loss of sensation.        Review of Systems  Genitourinary:  Negative for frequency.  Endo/Heme/Allergies:  Positive for polydipsia.  All other systems reviewed and are negative.      02/04/2023    2:50 PM 06/25/2015    2:09 PM 06/25/2015    2:06 PM 03/30/2015    3:49 PM 05/25/2014    1:16 PM  Depression screen PHQ 2/9  Decreased Interest 0 0 0 0 0  Down, Depressed, Hopeless 1 0 0 0 0  PHQ - 2 Score 1 0 0 0 0  Altered sleeping 0      Tired, decreased energy 1      Change in appetite 0      Feeling bad or failure about yourself  1      Trouble concentrating 1      Moving slowly or fidgety/restless 0      Suicidal thoughts 0      PHQ-9 Score 4      Difficult doing work/chores Not difficult at all           02/04/2023    2:50 PM  GAD 7 : Generalized Anxiety Score  Nervous, Anxious, on Edge 0  Control/stop worrying 0  Worry too much - different things 1  Trouble relaxing 0  Restless 0  Easily annoyed or irritable 0  Afraid - awful might happen  0  Total GAD 7 Score 1  Anxiety Difficulty Not difficult at all    Health Maintenance Due  Topic Date Due   FOOT EXAM  Never done   OPHTHALMOLOGY EXAM  Never done   Diabetic kidney evaluation - Urine ACR  Never done   Colonoscopy  Never done   HEMOGLOBIN A1C  08/01/2022      PMH:  The following were reviewed and entered/updated in epic: Past Medical History:  Diagnosis Date   Allergy    Diabetes mellitus without complication (HCC)    Hemorrhoids    Hypertension    Stroke [facility])     Patient Active Problem List   Diagnosis Date Noted   Umbilical hernia without obstruction and without gangrene 02/04/2023   PFO (patent foramen ovale) 02/04/2023   History of pulmonary embolism 02/04/2023   History of stroke 02/04/2023   Myalgia due to statin 02/04/2023   Intermittent left-sided chest pain 02/04/2023   Memory deficit 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 02/04/2023  Primary hypertension 02/04/2023   TIA (transient ischemic attack) 01/31/2022   Diabetes mellitus type 2, uncomplicated (HCC) 06/20/2015   History of colonic polyps 06/20/2015   Rectal bleeding 05/25/2014    Past Surgical History:  Procedure Laterality Date   WISDOM TOOTH EXTRACTION      Family History  Problem Relation Age of Onset   Hypertension Mother    Stroke Maternal Grandmother    Cancer Maternal Grandfather    Cancer Paternal Grandmother    Stroke Paternal Grandmother     Medications- reviewed and updated Outpatient Medications Prior to Visit  Medication Sig Dispense Refill   amLODipine (NORVASC) 10 MG tablet TAKE 1 TABLET BY MOUTH EVERY DAY 90 tablet 0   aspirin EC 81 MG tablet Take 1 tablet (81 mg total) by mouth daily. Swallow whole. 30 tablet 12   MAGNESIUM GLYCINATE PO Take by mouth.     UNABLE TO FIND Med Name: MAGNESIUM L THIANE     No facility-administered medications prior to visit.    Allergies  Allergen Reactions   Statins Other (See Comments)    Myalgia     Social History   Socioeconomic History   Marital status: Single    Spouse name: Not on file   Number of children: Not on file   Years of education: 20   Highest education level: Master's degree (e.g., MA, MS, MEng, MEd, MSW, MBA)  Occupational History   Occupation: Public Health  Tobacco Use   Smoking status: Never   Smokeless tobacco: Never  Vaping Use   Vaping status: Never Used  Substance and Sexual Activity   Alcohol use: No   Drug use: No   Sexual activity: Not Currently  Other Topics Concern   Not on file  Social History Narrative   Not on file   Social Drivers of Health   Financial Resource Strain: Not on file  Food Insecurity: Food Insecurity Present (02/04/2023)   Hunger Vital Sign    Worried About Running Out of Food in the Last Year: Sometimes true    Ran Out of Food in the Last Year: Sometimes true  Transportation Needs: No Transportation Needs (02/04/2023)   PRAPARE - Administrator, Civil Service (Medical): No    Lack of Transportation (Non-Medical): No  Physical Activity: Sufficiently Active (02/04/2023)   Exercise Vital Sign    Days of Exercise per Week: 5 days    Minutes of Exercise per Session: 150+ min  Stress: Stress Concern Present (02/04/2023)   [employer] of Occupational Health - Occupational Stress Questionnaire    Feeling of Stress : To some extent  Social Connections: Moderately Integrated (02/04/2023)   Social Connection and Isolation Panel [NHANES]    Frequency of Communication with Friends and Family: Three times a week    Frequency of Social Gatherings with Friends and Family: Once a week    Attends Religious Services: 1 to 4 times per year    Active Member of [employer] or Organizations: Yes    Attends Banker Meetings: 1 to 4 times per year    Marital Status: Never married           Objective:  Physical Exam: BP 136/82   Pulse 75   Temp 97.9 F (36.6 C) (Temporal)   Ht 5\' 10"  (1.778 m)   Wt 209  lb 6.4 oz (95 kg)   SpO2 98%   BMI 30.05 kg/m   Body mass index is 30.05 kg/m. Wt Readings from  Last 3 Encounters:  03/08/23 209 lb 6.4 oz (95 kg)  02/04/23 204 lb 9.6 oz (92.8 kg)  12/31/22 200 lb (90.7 kg)    Physical Exam Constitutional:      General: He is not in acute distress.    Appearance: Normal appearance. He is not ill-appearing or toxic-appearing.  HENT:     Head: Normocephalic and atraumatic.     Right Ear: Hearing, tympanic membrane, ear canal and external ear normal. There is no impacted cerumen.     Left Ear: Hearing, tympanic membrane, ear canal and external ear normal. There is no impacted cerumen.     Nose: Nose normal. No congestion.     Mouth/Throat:     Lips: No lesions.     Mouth: Mucous membranes are moist.     Pharynx: Oropharynx is clear. No oropharyngeal exudate.  Eyes:     General: No scleral icterus.       Right eye: No discharge.        Left eye: No discharge.     Extraocular Movements: Extraocular movements intact.     Conjunctiva/sclera: Conjunctivae normal.     Pupils: Pupils are equal, round, and reactive to light.  Neck:     Thyroid: No thyroid mass, thyromegaly or thyroid tenderness.  Cardiovascular:     Rate and Rhythm: Normal rate and regular rhythm.     Pulses: Normal pulses.     Heart sounds: Normal heart sounds.  Pulmonary:     Effort: Pulmonary effort is normal. No respiratory distress.     Breath sounds: Normal breath sounds.  Abdominal:     General: Abdomen is flat. Bowel sounds are normal.     Palpations: Abdomen is soft.     Tenderness: There is no abdominal tenderness.     Hernia: A hernia is present. Hernia is present in the umbilical area (Easily reducible).  Musculoskeletal:        General: Normal range of motion.     Cervical back: Normal range of motion.     Right lower leg: No edema.     Left lower leg: No edema.  Lymphadenopathy:     Cervical: No cervical adenopathy.  Skin:    General: Skin is warm and dry.      Findings: No rash.  Neurological:     General: No focal deficit present.     Mental Status: He is alert and oriented to person, place, and time. Mental status is at baseline.     Cranial Nerves: No cranial nerve deficit.     Deep Tendon Reflexes:     Reflex Scores:      Patellar reflexes are 2+ on the right side and 2+ on the left side. Psychiatric:        Mood and Affect: Mood normal. Affect is not inappropriate.        Speech: Speech normal.        Behavior: Behavior normal. Behavior is cooperative.        Thought Content: Thought content normal. Thought content does not include homicidal or suicidal ideation.        Judgment: Judgment normal. Judgment is not impulsive.         At today's visit, we discussed treatment options, associated risk and benefits, and engage in counseling as needed.  Additionally the following were reviewed: Past medical records, past medical and surgical history, family and social background, as well as relevant laboratory results, imaging findings, and specialty notes, where applicable.  This message was generated using dictation software, and as a result, it may contain unintentional typos or errors.  Nevertheless, extensive effort was made to accurately convey at the pertinent aspects of the patient visit.    There may have been are other unrelated non-urgent complaints, but due to the busy schedule and the amount of time already spent with him, time does not permit to address these issues at today's visit. Another appointment may have or has been requested to review these additional issues.     Thomes Dinning, MD, MS

## 2023-03-08 NOTE — Patient Instructions (Signed)
 VISIT SUMMARY:  Today, we reviewed your blood pressure and diabetes management, and conducted your annual exam. Your blood pressure has improved with medication, but we need to make further adjustments. We also discussed your diabetes control, diet, exercise, and other health maintenance needs.  YOUR PLAN:  -HYPERTENSION: Hypertension means high blood pressure. Your blood pressure has improved to 136/82 mmHg with amlodipine, but it is not yet at the target level. We are adding indapamide 1.25 mg daily to help lower it further. Continue taking amlodipine 10 mg daily. We will check your kidney function and electrolytes in 2 weeks to ensure there are no side effects. Please follow up in 3 months to recheck your blood pressure.  -TYPE 2 DIABETES MELLITUS: Type 2 Diabetes Mellitus is a condition where your blood sugar levels are too high. Your A1c is currently 7.0, and our goal is to reduce it below 7.0. You plan to resume exercise and improve your diet. We recommend a low-carb diet, increased fruits and vegetables, and healthy fats. Aim for 150 minutes of cardiovascular exercise per week. We will refer you to a nutritionist for dietary management and ask you to monitor your blood glucose levels regularly. Please follow up in 3 months to recheck your A1c.  -HEMORRHOIDS: Hemorrhoids are swollen veins in the lower rectum or anus. Your hemorrhoids have returned, so we will refer you to a specialist for management. Additionally, you should schedule a colonoscopy for cancer screening.  -GENERAL HEALTH MAINTENANCE: For your overall health, please schedule a diabetic eye exam and regular dental visits. We will order a PSA screening and check your thyroid function, blood counts, liver function, and electrolytes. Regular foot care and monitoring are also recommended.  INSTRUCTIONS:  Please follow up in 2 weeks for a repeat metabolic panel and in 3 months to recheck your blood pressure and A1c. We will call you  about the nutrition referral.

## 2023-03-22 ENCOUNTER — Other Ambulatory Visit

## 2023-03-31 ENCOUNTER — Other Ambulatory Visit (INDEPENDENT_AMBULATORY_CARE_PROVIDER_SITE_OTHER)

## 2023-03-31 DIAGNOSIS — I10 Essential (primary) hypertension: Secondary | ICD-10-CM

## 2023-03-31 LAB — BASIC METABOLIC PANEL
BUN: 11 mg/dL (ref 6–23)
CO2: 28 meq/L (ref 19–32)
Calcium: 9.3 mg/dL (ref 8.4–10.5)
Chloride: 104 meq/L (ref 96–112)
Creatinine, Ser: 1.34 mg/dL (ref 0.40–1.50)
GFR: 60.46 mL/min (ref >60.00)
Glucose, Bld: 137 mg/dL — ABNORMAL HIGH (ref 70–99)
Potassium: 3.6 meq/L (ref 3.5–5.1)
Sodium: 139 meq/L (ref 135–145)

## 2023-04-01 ENCOUNTER — Encounter: Payer: Self-pay | Admitting: Family Medicine

## 2023-06-10 ENCOUNTER — Ambulatory Visit: Admitting: Family Medicine

## 2023-06-24 ENCOUNTER — Ambulatory Visit: Admitting: Family Medicine

## 2023-06-24 VITALS — BP 136/86 | HR 87 | Temp 97.6°F | Ht 70.0 in | Wt 203.4 lb

## 2023-06-24 DIAGNOSIS — E1169 Type 2 diabetes mellitus with other specified complication: Secondary | ICD-10-CM

## 2023-06-24 DIAGNOSIS — E785 Hyperlipidemia, unspecified: Secondary | ICD-10-CM | POA: Diagnosis not present

## 2023-06-24 DIAGNOSIS — E1165 Type 2 diabetes mellitus with hyperglycemia: Secondary | ICD-10-CM

## 2023-06-24 DIAGNOSIS — I10 Essential (primary) hypertension: Secondary | ICD-10-CM | POA: Diagnosis not present

## 2023-06-24 LAB — POCT GLYCOSYLATED HEMOGLOBIN (HGB A1C): Hemoglobin A1C: 6.6 % — AB (ref 4.0–5.6)

## 2023-06-24 MED ORDER — INDAPAMIDE 1.25 MG PO TABS
1.2500 mg | ORAL_TABLET | Freq: Every day | ORAL | 3 refills | Status: AC
Start: 2023-06-24 — End: 2024-06-18

## 2023-06-24 MED ORDER — EZETIMIBE 10 MG PO TABS: 10.0000 mg | ORAL_TABLET | Freq: Every day | ORAL | 3 refills | Status: AC

## 2023-06-24 NOTE — Progress Notes (Signed)
 Assessment & Plan   Assessment/Plan:     Assessment & Plan Type 2 Diabetes Mellitus Type 2 diabetes mellitus with improved glycemic control. Hemoglobin A1c decreased from 7.0% to 6.6%, indicating better management. Adherence to lifestyle modifications, including exercise and dietary changes, has contributed to weight loss and improved blood glucose levels. - Continue current exercise and dietary regimen - Referral to ophthalmology for eye exam  - Follow up in 6 months to reassess glycemic control  Hypertension Hypertension with current blood pressure reading of 136/86 mmHg. Previously on amlodipine  and indapamide , he stopped indapamide  due to concurrent illness and medication burden. Considering resuming indapamide  as previous kidney function tests showed good tolerance. - Resume indapamide  as previously tolerated - Monitor blood pressure regularly  Hyperlipidemia Hyperlipidemia with worsening cholesterol levels. Statin intolerance due to myalgia. Ezetimibe  considered as an alternative to manage cholesterol levels without causing myalgia. Increased risk for cardiovascular events due to stroke, hypertension, and diabetes, making cholesterol management crucial. - Start ezetimibe  to manage cholesterol levels - Recheck fasting cholesterol at next visit to assess the effect of lifestyle changes and medication  Cardiovascular Health Maintenance Continues low-dose aspirin  for cardiovascular protection. - Continue low-dose aspirin  81 mg daily      Medications Discontinued During This Encounter  Medication Reason   indapamide  (LOZOL ) 1.25 MG tablet Reorder   ezetimibe  (ZETIA ) 10 MG tablet Reorder    Return in about 6 months (around 12/24/2023) for DM, fasting labs.        Subjective:   Encounter date: 06/24/2023  Andre Castro is a 53 y.o. male who has Rectal bleeding; Diabetes mellitus type 2, uncomplicated (HCC); History of colonic polyps; TIA (transient ischemic  attack); Umbilical hernia without obstruction and without gangrene; PFO (patent foramen ovale); History of pulmonary embolism; History of stroke; Myalgia due to statin; Intermittent left-sided chest pain; Memory deficit 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; Primary hypertension; Grade I hemorrhoids; Type 2 diabetes mellitus with hyperglycemia, without long-term current use of insulin  (HCC); Hyperlipidemia associated with type 2 diabetes mellitus (HCC); Class 1 obesity due to excess calories with serious comorbidity and body mass index (BMI) of 30.0 to 30.9 in adult; and Coronary artery disease due to calcified coronary lesion on their problem list..   He  has a past medical history of Allergy, Diabetes mellitus without complication (HCC), Hemorrhoids, Hypertension, and Stroke (HCC).Andre Castro   He presents with chief complaint of Follow-up .   Discussed the use of AI scribe software for clinical note transcription with the patient, who gave verbal consent to proceed.  History of Present Illness Andre Castro is a 53 year old male with diabetes and hypertension who presents for a follow-up visit.  His diabetes management shows improvement with a current A1c of 6.6, down from 7.0 a few months ago. He maintains an exercise routine and a healthy diet, although he acknowledges needing further improvement in his diet. He has lost approximately six pounds recently.  Regarding his hypertension, his current blood pressure reading is 136/86 mmHg. He was previously on amlodipine  10 mg and indapamide  but stopped taking indapamide  after experiencing a sinus infection last month. He felt he was taking too many medications at once, especially since he was also on a steroid and a strong antibiotic for the infection. He feels better now and is considering resuming indapamide .  He has a history of elevated cholesterol and is unable to take statins due to muscle aches. He continues to take aspirin  81 mg  daily.  He has a history of stroke, which increases the importance of managing his cholesterol to reduce the risk of recurrent stroke.  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 He has been mostly working and has had limited time to enjoy the summer outdoors.     ROS  Past Surgical History:  Procedure Laterality Date   WISDOM TOOTH EXTRACTION      Outpatient Medications Prior to Visit  Medication Sig Dispense Refill   amLODipine  (NORVASC ) 10 MG tablet Take 1 tablet (10 mg total) by mouth daily. 90 tablet 3   aspirin  EC 81 MG tablet Take 1 tablet (81 mg total) by mouth daily. Swallow whole. 30 tablet 12   MAGNESIUM GLYCINATE PO Take by mouth.     UNABLE TO FIND as needed. Med Name: MAGNESIUM L THIANE     ezetimibe  (ZETIA ) 10 MG tablet Take 1 tablet (10 mg total) by mouth daily. (Patient not taking: Reported on 06/24/2023) 90 tablet 3   indapamide  (LOZOL ) 1.25 MG tablet Take 1 tablet (1.25 mg total) by mouth daily. (Patient not taking: Reported on 06/24/2023) 90 tablet 3   No facility-administered medications prior to visit.    Family History  Problem Relation Age of Onset   Hypertension Mother    Stroke Maternal Grandmother    Cancer Maternal Grandfather    Cancer Paternal Grandmother    Stroke Paternal Grandmother     Social History   Socioeconomic History   Marital status: Single    Spouse name: Not on file   Number of children: Not on file   Years of education: 20   Highest education level: Master's degree (e.g., MA, MS, MEng, MEd, MSW, MBA)  Occupational History   Occupation: Public Health  Tobacco Use   Smoking status: Never   Smokeless tobacco: Never  Vaping Use   Vaping status: Never Used  Substance and Sexual Activity   Alcohol use: No   Drug use: No   Sexual activity: Not Currently  Other Topics Concern   Not on file  Social History Narrative   Not on file    Social Drivers of Health   Financial Resource Strain: Low Risk  (06/24/2023)   Overall Financial Resource Strain (CARDIA)    Difficulty of Paying Living Expenses: Not very hard  Food Insecurity: No Food Insecurity (06/24/2023)   Hunger Vital Sign    Worried About Running Out of Food in the Last Year: Never true    Ran Out of Food in the Last Year: Never true  Transportation Needs: No Transportation Needs (06/24/2023)   PRAPARE - Administrator, Civil Service (Medical): No    Lack of Transportation (Non-Medical): No  Physical Activity: Sufficiently Active (06/24/2023)   Exercise Vital Sign    Days of Exercise per Week: 5 days    Minutes of Exercise per Session: 150+ min  Stress: No Stress Concern Present (06/24/2023)   [employer] of Occupational Health - Occupational Stress Questionnaire    Feeling of Stress: Not at all  Social Connections: Moderately Integrated (06/24/2023)   Social Connection and Isolation Panel    Frequency of Communication with Friends and Family: Three times a week    Frequency of Social Gatherings with Friends and Family: Twice a week    Attends Religious Services: More than 4 times per year    Active Member of [employer] or Organizations: Yes    Attends [employer]  or Organization Meetings: 1 to 4 times per year    Marital Status: Never married  Intimate Partner Violence: Not At Risk (02/01/2022)   Humiliation, Afraid, Rape, and Kick questionnaire    Fear of Current or Ex-Partner: No    Emotionally Abused: No    Physically Abused: No    Sexually Abused: No                                                                                                  Objective:  Physical Exam: BP 136/86   Pulse 87   Temp 97.6 F (36.4 C) (Temporal)   Ht 5' 10 (1.778 m)   Wt 203 lb 6.4 oz (92.3 kg)   SpO2 98%   BMI 29.18 kg/m   Wt Readings from Last 3 Encounters:  06/24/23 203 lb 6.4 oz (92.3 kg)  03/08/23 209 lb 6.4 oz (95 kg)  02/04/23 204 lb 9.6 oz  (92.8 kg)    Physical Exam VITALS: BP- 136/86 GENERAL: Alert, cooperative, well developed, no acute distress HEENT: Normocephalic, normal oropharynx, moist mucous membranes CHEST: Clear to auscultation bilaterally, No wheezes, rhonchi, or crackles CARDIOVASCULAR: Normal heart rate and rhythm, S1 and S2 normal without murmurs ABDOMEN: Soft, non-tender, non-distended, without organomegaly, Normal bowel sounds EXTREMITIES: No cyanosis or edema NEUROLOGICAL: Cranial nerves grossly intact, Moves all extremities without gross motor or sensory deficit   Physical Exam  No results found.  Recent Results (from the past 2160 hours)  Basic Metabolic Panel (BMET)     Status: Abnormal   Collection Time: 03/31/23 11:03 AM  Result Value Ref Range   Sodium 139 135 - 145 mEq/L   Potassium 3.6 3.5 - 5.1 mEq/L   Chloride 104 96 - 112 mEq/L   CO2 28 19 - 32 mEq/L   Glucose, Bld 137 (H) 70 - 99 mg/dL   BUN 11 6 - 23 mg/dL   Creatinine, Ser 6.57 0.40 - 1.50 mg/dL   GFR 84.69 >62.95 mL/min    Comment: Calculated using the CKD-EPI Creatinine Equation (2021)   Calcium  9.3 8.4 - 10.5 mg/dL  POCT glycosylated hemoglobin (Hb A1C)     Status: Abnormal   Collection Time: 06/24/23  1:18 PM  Result Value Ref Range   Hemoglobin A1C 6.6 (A) 4.0 - 5.6 %   HbA1c POC (<> result, manual entry)     HbA1c, POC (prediabetic range)     HbA1c, POC (controlled diabetic range)          Carnell Christian, MD, MS

## 2023-12-24 ENCOUNTER — Ambulatory Visit: Admitting: Family Medicine

## 2023-12-28 ENCOUNTER — Ambulatory Visit: Admitting: Family Medicine
